# Patient Record
Sex: Female | Born: 1960
Health system: Southern US, Community
[De-identification: ages and names within clinical notes are randomized; demographics above are authoritative.]

## PROBLEM LIST (undated history)

## (undated) DIAGNOSIS — E669 Obesity, unspecified: Secondary | ICD-10-CM

## (undated) DIAGNOSIS — M1990 Unspecified osteoarthritis, unspecified site: Secondary | ICD-10-CM

## (undated) DIAGNOSIS — E079 Disorder of thyroid, unspecified: Secondary | ICD-10-CM

## (undated) DIAGNOSIS — I480 Paroxysmal atrial fibrillation: Secondary | ICD-10-CM

## (undated) DIAGNOSIS — M549 Dorsalgia, unspecified: Secondary | ICD-10-CM

## (undated) DIAGNOSIS — G8929 Other chronic pain: Secondary | ICD-10-CM

## (undated) DIAGNOSIS — E785 Hyperlipidemia, unspecified: Secondary | ICD-10-CM

## (undated) DIAGNOSIS — G459 Transient cerebral ischemic attack, unspecified: Secondary | ICD-10-CM

## (undated) DIAGNOSIS — I4891 Unspecified atrial fibrillation: Secondary | ICD-10-CM

## (undated) DIAGNOSIS — E039 Hypothyroidism, unspecified: Secondary | ICD-10-CM

## (undated) DIAGNOSIS — I10 Essential (primary) hypertension: Secondary | ICD-10-CM

## (undated) HISTORY — DX: Essential (primary) hypertension: I10

## (undated) HISTORY — DX: Unspecified atrial fibrillation: I48.91

## (undated) HISTORY — DX: Disorder of thyroid, unspecified: E07.9

## (undated) HISTORY — DX: Hyperlipidemia, unspecified: E78.5

## (undated) HISTORY — DX: Other chronic pain: G89.29

## (undated) HISTORY — DX: Dorsalgia, unspecified: M54.9

---

## 1990-11-18 HISTORY — PX: CHOLECYSTECTOMY: SHX55

## 2006-11-18 HISTORY — PX: ABDOMINAL HYSTERECTOMY: SHX81

## 2014-08-08 ENCOUNTER — Ambulatory Visit: Payer: Self-pay

## 2016-02-05 ENCOUNTER — Ambulatory Visit (INDEPENDENT_AMBULATORY_CARE_PROVIDER_SITE_OTHER): Payer: Self-pay | Admitting: Internal Medicine

## 2016-02-05 ENCOUNTER — Encounter: Payer: Self-pay | Admitting: Internal Medicine

## 2016-02-05 VITALS — BP 130/68 | HR 54 | Temp 98.0°F | Wt 228.4 lb

## 2016-02-05 DIAGNOSIS — E038 Other specified hypothyroidism: Secondary | ICD-10-CM

## 2016-02-05 DIAGNOSIS — E039 Hypothyroidism, unspecified: Secondary | ICD-10-CM | POA: Insufficient documentation

## 2016-02-05 DIAGNOSIS — E559 Vitamin D deficiency, unspecified: Secondary | ICD-10-CM | POA: Insufficient documentation

## 2016-02-05 DIAGNOSIS — Z0000 Encounter for general adult medical examination without abnormal findings: Secondary | ICD-10-CM | POA: Insufficient documentation

## 2016-02-05 DIAGNOSIS — I480 Paroxysmal atrial fibrillation: Secondary | ICD-10-CM

## 2016-02-05 DIAGNOSIS — D573 Sickle-cell trait: Secondary | ICD-10-CM

## 2016-02-05 DIAGNOSIS — I4891 Unspecified atrial fibrillation: Secondary | ICD-10-CM | POA: Insufficient documentation

## 2016-02-05 DIAGNOSIS — I10 Essential (primary) hypertension: Secondary | ICD-10-CM

## 2016-02-05 DIAGNOSIS — E785 Hyperlipidemia, unspecified: Secondary | ICD-10-CM

## 2016-02-05 LAB — BASIC METABOLIC PANEL WITH GFR
BUN: 14 mg/dL (ref 7–25)
CALCIUM: 9.1 mg/dL (ref 8.6–10.4)
CO2: 26 mmol/L (ref 20–31)
Chloride: 107 mmol/L (ref 98–110)
Creat: 1.08 mg/dL — ABNORMAL HIGH (ref 0.50–1.05)
GFR, EST AFRICAN AMERICAN: 66 mL/min (ref >=60)
GFR, EST NON AFRICAN AMERICAN: 58 mL/min — AB (ref >=60)
GLUCOSE: 87 mg/dL (ref 65–99)
Potassium: 4.2 mmol/L (ref 3.5–5.3)
SODIUM: 142 mmol/L (ref 135–146)

## 2016-02-05 LAB — TSH: TSH: 3.51 m[IU]/L

## 2016-02-05 NOTE — Assessment & Plan Note (Signed)
Has been taking Vitamin D 50,000 units every 2 weeks. States that the medication causes bone pain. She would like to stop it if she doesn't need it. - Will check a Vitamin D level today - May need to restart Vitamin D 50,000 units if Vitamin D level is low - Follow-up in 3 months

## 2016-02-05 NOTE — Progress Notes (Signed)
[name] Phone: [phone]  Subjective:  Kristi Duncan is here to establish care and meet her new physician. She recently moved from [place] to [place] to live with her daughter.   -Hypertension- Hasn't taken Lisinopril since December. Takes Norvasc  daily and Atenolol 57  daily. Endorses occasional chest pain, not always related to exertion. No lower extremity edema. Does not check her BPs at home. No medication side effects.    -Hypothyroidism- Hasn't taken the Levothyroxine since the end of February. She has cold intolerance, dry skin, occasional constipation. She has had some intentional weight loss. No fatigue.   -Atrial Fibrillation- Notices an irregular heartbeat every 2-3 weeks. Lasts a few minutes to 45 minutes. She monitors her caffeine intake. She takes Atenolol daily. No medication side effects. No fainting. She has taken Xarelto in the past, but had a lot of problems with bleeding from her gums. She followed with a cardiologist in [place], but has not found a cardiologist in [place] yet.   -Vitamin D deficiency- Has been told her Vitamin D levels are low. Was taking Vitamin D 50,000 units every 2 weeks. She feels like she has bone pain every time she takes the Vitamin D. Sometimes the bone pain lasts a couple of days and sometimes it can last up to 3 weeks. She would like to stop taking the Vitamin D if she doesn't need it.   -Health care maintenance- Last Pap in 2008, had a hysterectomy in 2008 for fibroids. Last mammogram in 2006. Has never had an abnormal mammogram. She has never had a colonoscopy. She would also like a referral to a dentist in the area for routine dental care.  ROS: See HPI for pertinent positives and negatives  Past Medical History- hypertension, hypothyroidism, hyperlipidemia, Vitamin D deficiency, sickle cell trait, paroxysmal A-fib  Past Surgical History- Cholecystectomy in 1992, Hysterectomy in 2008   Allergies- Codeine, Stadol,  Prednisone, Hydrocodone, Xarelto, HCTZ, Simvastatin   Medications- updated  Current Outpatient Prescriptions  Medication Sig Dispense Refill  . amLODipine (NORVASC) 5 MG tablet Take 5 mg by mouth daily.    Marland Kitchen atenolol (TENORMIN) 25 MG tablet Take 25 mg by mouth daily.    Marland Kitchen levothyroxine (SYNTHROID, LEVOTHROID) 75 MCG tablet Take 75 mcg by mouth daily before breakfast.    . lisinopril (PRINIVIL,ZESTRIL) 20 MG tablet Take 40 mg by mouth daily.    . Vitamin D, Ergocalciferol, (DRISDOL) 50000 units CAPS capsule Take 50,000 Units by mouth every 14 (fourteen) days.     No current facility-administered medications for this visit.   Chief complaint-noted Family history reviewed for today's visit. No changes. Social history- patient is a former smoker (quit 25 years ago). Occasional alcohol use (1 glass of wine). No drug use. Lives at home with 57 year old daughter.  Objective: There were no vitals taken for this visit. Gen: NAD, alert, cooperative with exam HEENT: NCAT, EOMI, MMM Neck: FROM, supple, no cervical lymphadenopathy, thyroid normal without nodules CV: RRR, no murmur Resp: CTABL, no wheezes, normal work of breathing GI: SNTND, BS present, no guarding or organomegaly Msk: No edema, warm, normal tone, moves UE/LE spontaneously Neuro: Alert and oriented, no gross deficits Skin: No rashes, no lesions Psych: Appropriate behavior  Assessment/Plan: Hypertension: Taking Norvasc  daily and Atenolol 57  daily. She is supposed to be taking Lisinopril 57 , but has not taken any since she ran out of refills in December. BP 148/70 in clinic, 130/68 on re-check. Pt states that she does not  want to take the Lisinopril if she doesn't have to. BP goal <140/<90. - Given that she is currently under her BP goal, will hold off on re-starting Lisinopril for now - Continue Norvasc 5mg  daily and Atenolol 25 mg daily - Advised Pt to check her blood pressures at home - Follow-up in 3  months  Hypothyroidism: Hasn't taken Levothyroxine 75mcg daily since running out of prescription at the end of February.  - Will check TSH today - If TSH level is high, will restart Levothyroxine 75mcg daily - Follow-up in 3 months  Atrial Fibrillation: Pt has episodes of palpitations every 2-3 weeks. They last a few seconds to 45 minutes. She was seen by a cardiologist in [place], but has not found a cardiologist in [place] since moving. Takes Atenolol 25mg  daily. Cardiac exam is normal today, not currently in A-fib. - Continue Atenolol 25mg  daily. - Pt referred to Cardiology to establish care. May need long term anticoagulation for paroxysmal A-fib.  - Follow-up in 3 months.  Vitamin D deficiency: Has been taking Vitamin D 50,000 units every 2 weeks. States that the medication causes bone pain. She would like to stop it if she doesn't need it. - Will check a Vitamin D level today - May need to restart Vitamin D 50,000 units if Vitamin D level is low - Follow-up in 3 months  Health Care Maintenance: Last pap in 2008 (had complete hysterectomy in 2008). Last mammogram in 2006. Has never had an abnormal pap or mammogram. Has never had a colonoscopy. Would also like referral to dentistry. She does not currently have insurance, but is in the process of getting the orange card. - Once she has the orange card, will refer Pt for mammogram, colonoscopy, and routine dental care. Will also do Hep C and HIV screening. - Follow-up in 3 months  Willadean CarolKaty Mayo, MD PGY-1

## 2016-02-05 NOTE — Assessment & Plan Note (Addendum)
Last pap in 2008 (had complete hysterectomy in 2008). Last mammogram in 2006. Has never had an abnormal pap or mammogram. Has never had a colonoscopy. Would also like referral to dentistry. She does not currently have insurance, but is in the process of getting the orange card. - Once she has the orange card, will refer Pt for mammogram, colonoscopy, and routine dental care. Will also do Hep C and HIV screening. - Follow-up in 3 months.

## 2016-02-05 NOTE — Assessment & Plan Note (Signed)
Hasn't taken Levothyroxine 75mcg daily since running out of prescription at the end of February.  - Will check TSH today - If TSH level is high, will restart Levothyroxine 75mcg daily - Follow-up in 3 months

## 2016-02-05 NOTE — Assessment & Plan Note (Signed)
Pt has episodes of palpitations every 2-3 weeks. They last a few seconds to 45 minutes. She was seen by a cardiologist in [place], but has not found a cardiologist in [place] since moving. Takes Atenolol 25mg  daily. Cardiac exam is normal today, not currently in A-fib. - Continue Atenolol 25mg  daily. - Pt referred to Cardiology to establish care. May need long term anticoagulation for paroxysmal A-fib.  - Follow-up in 3 months.

## 2016-02-05 NOTE — Patient Instructions (Signed)
It was so nice to meet you!  I have ordered some labs today- thyroid level, electrolyte panel, and Vitamin D level. I will call you with these results to discuss if we need to restart any of your medications. I will also send you a copy of the results in the mail for your records.  I have sent in a referral to Cardiology for you to establish care. You should hear from our office in the next 2 weeks to schedule an appointment.   [facility], I will send in referrals for you to get a mammogram and colonoscopy and for you to see the dentist.   We will see you back in 3 months!  Take care, Dr. Nancy MarusMayo

## 2016-02-05 NOTE — Assessment & Plan Note (Signed)
Taking Norvasc 5mg  daily and Atenolol 25mg  daily. She is supposed to be taking Lisinopril 40mg , but has not taken any since she ran out of refills in December. BP 148/70 in clinic, 130/68 on re-check. Pt states that she does not want to take the Lisinopril if she doesn't have to. BP goal <140/<90. - Given that she is currently under her BP goal, will hold off on re-starting Lisinopril for now - Continue Norvasc 5mg  daily and Atenolol 25 mg daily - Advised Pt to check her blood pressures at home - Follow-up in 3 months

## 2016-02-06 ENCOUNTER — Encounter: Payer: Self-pay | Admitting: Internal Medicine

## 2016-02-06 LAB — VITAMIN D 25 HYDROXY (VIT D DEFICIENCY, FRACTURES): VIT D 25 HYDROXY: 21 ng/mL — AB (ref 30–100)

## 2016-02-20 ENCOUNTER — Emergency Department (HOSPITAL_COMMUNITY): Payer: Self-pay

## 2016-02-20 ENCOUNTER — Encounter (HOSPITAL_COMMUNITY): Payer: Self-pay | Admitting: Emergency Medicine

## 2016-02-20 ENCOUNTER — Emergency Department (HOSPITAL_COMMUNITY)
Admission: EM | Admit: 2016-02-20 | Discharge: 2016-02-20 | Disposition: A | Discharge status: Home / Self Care | Payer: Self-pay | Attending: Emergency Medicine | Admitting: Emergency Medicine

## 2016-02-20 DIAGNOSIS — R001 Bradycardia, unspecified: Secondary | ICD-10-CM | POA: Insufficient documentation

## 2016-02-20 DIAGNOSIS — M5440 Lumbago with sciatica, unspecified side: Secondary | ICD-10-CM | POA: Insufficient documentation

## 2016-02-20 DIAGNOSIS — I10 Essential (primary) hypertension: Secondary | ICD-10-CM | POA: Insufficient documentation

## 2016-02-20 DIAGNOSIS — M5441 Lumbago with sciatica, right side: Secondary | ICD-10-CM

## 2016-02-20 DIAGNOSIS — R079 Chest pain, unspecified: Secondary | ICD-10-CM | POA: Insufficient documentation

## 2016-02-20 DIAGNOSIS — Z79899 Other long term (current) drug therapy: Secondary | ICD-10-CM | POA: Insufficient documentation

## 2016-02-20 DIAGNOSIS — N390 Urinary tract infection, site not specified: Secondary | ICD-10-CM | POA: Insufficient documentation

## 2016-02-20 DIAGNOSIS — G8929 Other chronic pain: Secondary | ICD-10-CM | POA: Insufficient documentation

## 2016-02-20 DIAGNOSIS — M5442 Lumbago with sciatica, left side: Secondary | ICD-10-CM

## 2016-02-20 DIAGNOSIS — Z87891 Personal history of nicotine dependence: Secondary | ICD-10-CM | POA: Insufficient documentation

## 2016-02-20 DIAGNOSIS — Z8639 Personal history of other endocrine, nutritional and metabolic disease: Secondary | ICD-10-CM | POA: Insufficient documentation

## 2016-02-20 LAB — BASIC METABOLIC PANEL
Anion gap: 7 (ref 5–15)
BUN: 17 mg/dL (ref 6–20)
CALCIUM: 9.3 mg/dL (ref 8.9–10.3)
CO2: 27 mmol/L (ref 22–32)
CREATININE: 0.98 mg/dL (ref 0.44–1.00)
Chloride: 108 mmol/L (ref 101–111)
GFR calc non Af Amer: >60 mL/min (ref >60)
GLUCOSE: 96 mg/dL (ref 65–99)
Potassium: 3.9 mmol/L (ref 3.5–5.1)
Sodium: 142 mmol/L (ref 135–145)

## 2016-02-20 LAB — URINALYSIS, ROUTINE W REFLEX MICROSCOPIC
BILIRUBIN URINE: NEGATIVE
GLUCOSE, UA: NEGATIVE mg/dL
HGB URINE DIPSTICK: NEGATIVE
KETONES UR: NEGATIVE mg/dL
Nitrite: NEGATIVE
Protein, ur: NEGATIVE mg/dL
Specific Gravity, Urine: 1.012 (ref 1.005–1.030)
pH: 6 (ref 5.0–8.0)

## 2016-02-20 LAB — TROPONIN I

## 2016-02-20 LAB — CBC WITH DIFFERENTIAL/PLATELET
BASOS ABS: 0 10*3/uL (ref 0.0–0.1)
Basophils Relative: 1 %
EOS ABS: 0.1 10*3/uL (ref 0.0–0.7)
Eosinophils Relative: 2 %
HCT: 37.7 % (ref 36.0–46.0)
HEMOGLOBIN: 12.8 g/dL (ref 12.0–15.0)
LYMPHS ABS: 1.2 10*3/uL (ref 0.7–4.0)
LYMPHS PCT: 23 %
MCH: 28.1 pg (ref 26.0–34.0)
MCHC: 34 g/dL (ref 30.0–36.0)
MCV: 82.7 fL (ref 78.0–100.0)
Monocytes Absolute: 0.5 10*3/uL (ref 0.1–1.0)
Monocytes Relative: 9 %
NEUTROS PCT: 65 %
Neutro Abs: 3.6 10*3/uL (ref 1.7–7.7)
PLATELETS: 242 10*3/uL (ref 150–400)
RBC: 4.56 MIL/uL (ref 3.87–5.11)
RDW: 13.5 % (ref 11.5–15.5)
WBC: 5.5 10*3/uL (ref 4.0–10.5)

## 2016-02-20 LAB — URINE MICROSCOPIC-ADD ON

## 2016-02-20 MED ORDER — HYDROMORPHONE HCL 2 MG PO TABS
2.0000 mg | ORAL_TABLET | Freq: Once | ORAL | Status: AC
  Administered 2016-02-20: 2 mg via ORAL
  Filled 2016-02-20: qty 1

## 2016-02-20 MED ORDER — SULFAMETHOXAZOLE-TRIMETHOPRIM 800-160 MG PO TABS: 1.0000 | ORAL_TABLET | Freq: Two times a day (BID) | ORAL | Status: DC

## 2016-02-20 MED ORDER — HYDROMORPHONE HCL 2 MG PO TABS: 2.0000 mg | ORAL_TABLET | Freq: Four times a day (QID) | ORAL | Status: DC | PRN

## 2016-02-20 NOTE — ED Notes (Signed)
Bed: WA13 Expected date:  Expected time:  Means of arrival:  Comments: EMS/back pain 

## 2016-02-20 NOTE — ED Provider Notes (Signed)
CSN: [id number]     Arrival date & time 02/20/16  [id number] History   First MD Initiated Contact with Patient 02/20/16 [phone]     Chief Complaint  Patient presents with  . Back Pain     (Consider location/radiation/quality/duration/timing/severity/associated sxs/prior Treatment) Patient is a 55 y.o. female presenting with back pain. The history is provided by the patient.  Back Pain Associated symptoms: chest pain   Associated symptoms: no abdominal pain, no headaches, no numbness and no weakness   Patient presents acute on chronic low back pain. Has been bad for a while but worse the last few days. It is dull and does good on both legs. States she gets spasms greatly. States she was sitting on the toilet today and she had a spasm and almost passed out. States she got up and was able to wash her face with some cold water began to feel better. No chest pain. No trouble breathing. Does have a history of atrial fibrillation and states she has been feeling some palpitations more frequently. Did not feel her heart going slow trend event. No fevers or chills. No loss of bladder bowel control. No headache. She's been doing well otherwise. She states she would occasionally get chest pain but has not had it recently. Patient states she had been on vitamin D for osteoporosis and then had been off because she felt bad. States that she started up recently and was feeling bad also.  Past Medical History  Diagnosis Date  . Hypertension   . Hyperlipidemia   . Thyroid disease    Past Surgical History  Procedure Laterality Date  . Cholecystectomy  1992  . Abdominal hysterectomy  2008   Family History  Problem Relation Age of Onset  . Stroke Maternal Grandmother   . Heart disease Maternal Grandmother   . Heart disease Maternal Grandfather   . Stroke Maternal Grandfather   . Stroke Paternal Grandmother   . Heart disease Paternal Grandmother   . Heart disease Paternal Grandfather   . Stroke Paternal  Grandfather    Social History  Substance Use Topics  . Smoking status: Former Smoker    Types: Cigarettes    Quit date: 11/19/1991  . Smokeless tobacco: None  . Alcohol Use: Yes     Comment: occasional   OB History    No data available     Review of Systems  Constitutional: Negative for activity change and appetite change.  Eyes: Negative for pain.  Respiratory: Negative for chest tightness and shortness of breath.   Cardiovascular: Positive for chest pain. Negative for leg swelling.  Gastrointestinal: Negative for nausea, vomiting, abdominal pain and diarrhea.  Genitourinary: Negative for flank pain.  Musculoskeletal: Positive for back pain. Negative for neck stiffness.  Skin: Negative for rash.  Neurological: Positive for light-headedness. Negative for weakness, numbness and headaches.  Psychiatric/Behavioral: Negative for behavioral problems.      Allergies  Iodine; Codeine; Hctz; Hydrocodone; Ivp dye; Prednisone; Simvastatin; Stadol; Vitamin d analogs; Xarelto; and Nsaids  Home Medications   Prior to Admission medications   Medication Sig Start Date End Date Taking? Authorizing Provider  acetaminophen (TYLENOL) 500 MG tablet Take 500 mg by mouth every 6 (six) hours as needed for mild pain, moderate pain, fever or headache.   Yes Historical Provider, MD  amLODipine (NORVASC) 5 MG tablet Take 5 mg by mouth daily.   Yes Historical Provider, MD  atenolol (TENORMIN) 25 MG tablet Take 25 mg by mouth daily.   Yes Historical  Provider, MD  diltiazem (CARDIZEM LA) 180 MG 24 hr tablet Take 180 mg by mouth daily as needed (for heart issues).   Yes Historical Provider, MD  Vitamin D, Ergocalciferol, (DRISDOL) 50000 units CAPS capsule Take 50,000 Units by mouth every 14 (fourteen) days. Reported on 02/20/2016   Yes Historical Provider, MD  HYDROmorphone (DILAUDID) 2 MG tablet Take 1 tablet (2 mg total) by mouth every 6 (six) hours as needed for severe pain. 02/20/16   Benjiman Core, MD   sulfamethoxazole-trimethoprim (BACTRIM DS,SEPTRA DS) 800-160 MG tablet Take 1 tablet by mouth 2 (two) times daily. 02/20/16   Benjiman Core, MD   BP 136/60 mmHg  Pulse 46  Temp(Src) 98.4 F (36.9 C) (Oral)  Resp 16  SpO2 99% Physical Exam  Constitutional: She appears well-developed.  HENT:  Head: Normocephalic.  Neck: Neck supple.  Cardiovascular: Normal rate.   Pulmonary/Chest: Effort normal.  Abdominal: There is no tenderness.  Musculoskeletal: She exhibits tenderness.  Lumbar paraspinal tenderness. Pain with straight leg raise bilaterally. Peroneal sensation intact.  Neurological: She is alert.  Skin: Skin is warm.    ED Course  Procedures (including critical care time) Labs Review Labs Reviewed  URINALYSIS, ROUTINE W REFLEX MICROSCOPIC (NOT AT [facility]) - Abnormal; Notable for the following:    Leukocytes, UA MODERATE (*)    All other components within normal limits  URINE MICROSCOPIC-ADD ON - Abnormal; Notable for the following:    Squamous Epithelial / LPF 0-5 (*)    Bacteria, UA FEW (*)    All other components within normal limits  I-STAT CHEM 8, ED - Abnormal; Notable for the following:    Potassium 6.8 (*)    BUN 24 (*)    Calcium, Ion 1.06 (*)    All other components within normal limits  TROPONIN I  CBC WITH DIFFERENTIAL/PLATELET  BASIC METABOLIC PANEL    Imaging Review Dg Lumbar Spine Complete  02/20/2016  CLINICAL DATA:  Chronic back pain.  Vagal response. EXAM: LUMBAR SPINE - COMPLETE 4+ VIEW COMPARISON:  None. FINDINGS: There are 5 nonrib bearing lumbar-type vertebral bodies. The vertebral body heights are maintained. The alignment is anatomic. There is no spondylolysis. There is no static listhesis. There is no acute fracture. Mild degenerative disc disease at L4-5. Bilateral facet arthropathy at L3-4 and L4-5. The SI joints are unremarkable. IMPRESSION: No acute osseous injury of the lumbar spine. Mild lumbar spine spondylosis as described above.  Electronically Signed   By: Elige Ko   On: 02/20/2016 10:14   I have personally reviewed and evaluated these images and lab results as part of my medical decision-making.   EKG Interpretation   Date/Time:  Tuesday February 20 2016 09:32:24 EDT Ventricular Rate:  51 PR Interval:  131 QRS Duration: 92 QT Interval:  453 QTC Calculation: 417 R Axis:   2 Text Interpretation:  Sinus rhythm Right atrial enlargement Low voltage,  extremity and precordial leads Confirmed by Sergi Gellner  MD, Harrold Donath [phone])  on 02/20/2016 10:16:00 AM      MDM   Final diagnoses:  Bilateral low back pain with sciatica, sciatica laterality unspecified  Acute lower UTI (urinary tract infection)    Patient with back pain. Acute on chronic. X-ray reassuring. Patient has mild bradycardia. Had a near syncopal episode was likely vagal. Lab work reassuring but does have a possible urinary tract infection. Will treat with antibodies. Will discharge home follow-up as needed. She R he has cardiology follow-up. Given neurosurgery information for her back pain  Benjiman Core, MD 02/20/16 [phone]

## 2016-02-20 NOTE — ED Notes (Signed)
Per EMS, from home, c/o chronic back pain that has worsened after having a vagal response and felt faint. Did not lose consciousness. After episode had significantly worsened back pain that ran down both legs. Left leg worse than right leg, pulse movement sensation all intact. Hx of sciatica   20 LAC, no meds given in route.

## 2016-02-20 NOTE — Discharge Instructions (Signed)
Urinary Tract Infection Urinary tract infections (UTIs) can develop anywhere along your urinary tract. Your urinary tract is your body's drainage system for removing wastes and extra water. Your urinary tract includes two kidneys, two ureters, a bladder, and a urethra. Your kidneys are a pair of bean-shaped organs. Each kidney is about the size of your fist. They are located below your ribs, one on each side of your spine. CAUSES Infections are caused by microbes, which are microscopic organisms, including fungi, viruses, and bacteria. These organisms are so small that they can only be seen through a microscope. Bacteria are the microbes that most commonly cause UTIs. SYMPTOMS  Symptoms of UTIs may vary by age and gender of the patient and by the location of the infection. Symptoms in young women typically include a frequent and intense urge to urinate and a painful, burning feeling in the bladder or urethra during urination. Older women and men are more likely to be tired, shaky, and weak and have muscle aches and abdominal pain. A fever may mean the infection is in your kidneys. Other symptoms of a kidney infection include pain in your back or sides below the ribs, nausea, and vomiting. DIAGNOSIS To diagnose a UTI, your caregiver will ask you about your symptoms. Your caregiver will also ask you to provide a urine sample. The urine sample will be tested for bacteria and white blood cells. White blood cells are made by your body to help fight infection. TREATMENT  Typically, UTIs can be treated with medication. Because most UTIs are caused by a bacterial infection, they usually can be treated with the use of antibiotics. The choice of antibiotic and length of treatment depend on your symptoms and the type of bacteria causing your infection. HOME CARE INSTRUCTIONS  If you were prescribed antibiotics, take them exactly as your caregiver instructs you. Finish the medication even if you feel better after  you have only taken some of the medication.  Drink enough water and fluids to keep your urine clear or pale yellow.  Avoid caffeine, tea, and carbonated beverages. They tend to irritate your bladder.  Empty your bladder often. Avoid holding urine for long periods of time.  Empty your bladder before and after sexual intercourse.  After a bowel movement, women should cleanse from front to back. Use each tissue only once. SEEK MEDICAL CARE IF:   You have back pain.  You develop a fever.  Your symptoms do not begin to resolve within 3 days. SEEK IMMEDIATE MEDICAL CARE IF:   You have severe back pain or lower abdominal pain.  You develop chills.  You have nausea or vomiting.  You have continued burning or discomfort with urination. MAKE SURE YOU:   Understand these instructions.  Will watch your condition.  Will get help right away if you are not doing well or get worse.   This information is not intended to replace advice given to you by your health care provider. Make sure you discuss any questions you have with your health care provider.   Document Released: 08/14/2005 Document Revised: 07/26/2015 Document Reviewed: 12/13/2011 Elsevier Interactive Patient Education 2016 [employer] Inc[employer] Sciatica Sciatica is pain, weakness, numbness, or tingling along the path of the sciatic nerve. The nerve starts in the lower back and runs down the back of each leg. The nerve controls the muscles in the lower leg and in the back of the knee, while also providing sensation to the back of the thigh, lower leg, and the sole   of your foot. Sciatica is a symptom of another medical condition. For instance, nerve damage or certain conditions, such as a herniated disk or bone spur on the spine, pinch or put pressure on the sciatic nerve. This causes the pain, weakness, or other sensations normally associated with sciatica. Generally, sciatica only affects one side of the body. CAUSES   Herniated or  slipped disc.  Degenerative disk disease.  A pain disorder involving the narrow muscle in the buttocks (piriformis syndrome).  Pelvic injury or fracture.  Pregnancy.  Tumor (rare). SYMPTOMS  Symptoms can vary from mild to very severe. The symptoms usually travel from the low back to the buttocks and down the back of the leg. Symptoms can include:  Mild tingling or dull aches in the lower back, leg, or hip.  Numbness in the back of the calf or sole of the foot.  Burning sensations in the lower back, leg, or hip.  Sharp pains in the lower back, leg, or hip.  Leg weakness.  Severe back pain inhibiting movement. These symptoms may get worse with coughing, sneezing, laughing, or prolonged sitting or standing. Also, being overweight may worsen symptoms. DIAGNOSIS  Your caregiver will perform a physical exam to look for common symptoms of sciatica. He or she may ask you to do certain movements or activities that would trigger sciatic nerve pain. Other tests may be performed to find the cause of the sciatica. These may include:  Blood tests.  X-rays.  Imaging tests, such as an MRI or CT scan. TREATMENT  Treatment is directed at the cause of the sciatic pain. Sometimes, treatment is not necessary and the pain and discomfort goes away on its own. If treatment is needed, your caregiver may suggest:  Over-the-counter medicines to relieve pain.  Prescription medicines, such as anti-inflammatory medicine, muscle relaxants, or narcotics.  Applying heat or ice to the painful area.  Steroid injections to lessen pain, irritation, and inflammation around the nerve.  Reducing activity during periods of pain.  Exercising and stretching to strengthen your abdomen and improve flexibility of your spine. Your caregiver may suggest losing weight if the extra weight makes the back pain worse.  Physical therapy.  Surgery to eliminate what is pressing or pinching the nerve, such as a bone spur  or part of a herniated disk. HOME CARE INSTRUCTIONS   Only take over-the-counter or prescription medicines for pain or discomfort as directed by your caregiver.  Apply ice to the affected area for 20 minutes, 3-4 times a day for the first 48-72 hours. Then try heat in the same way.  Exercise, stretch, or perform your usual activities if these do not aggravate your pain.  Attend physical therapy sessions as directed by your caregiver.  Keep all follow-up appointments as directed by your caregiver.  Do not wear high heels or shoes that do not provide proper support.  Check your mattress to see if it is too soft. A firm mattress may lessen your pain and discomfort. SEEK IMMEDIATE MEDICAL CARE IF:   You lose control of your bowel or bladder (incontinence).  You have increasing weakness in the lower back, pelvis, buttocks, or legs.  You have redness or swelling of your back.  You have a burning sensation when you urinate.  You have pain that gets worse when you lie down or awakens you at night.  Your pain is worse than you have experienced in the past.  Your pain is lasting longer than 4 weeks.  You are   suddenly losing weight without reason. MAKE SURE YOU:  Understand these instructions.  Will watch your condition.  Will get help right away if you are not doing well or get worse.   This information is not intended to replace advice given to you by your health care provider. Make sure you discuss any questions you have with your health care provider.   Document Released: 10/29/2001 Document Revised: 07/26/2015 Document Reviewed: 03/15/2012 Elsevier Interactive Patient Education 2016 Elsevier Inc[employer]  

## 2016-02-21 LAB — I-STAT CHEM 8, ED
BUN: 24 mg/dL — AB (ref 6–20)
CALCIUM ION: 1.06 mmol/L — AB (ref 1.12–1.23)
CHLORIDE: 108 mmol/L (ref 101–111)
CREATININE: 1 mg/dL (ref 0.44–1.00)
Glucose, Bld: 96 mg/dL (ref 65–99)
HCT: 40 % (ref 36.0–46.0)
Hemoglobin: 13.6 g/dL (ref 12.0–15.0)
POTASSIUM: 6.8 mmol/L — AB (ref 3.5–5.1)
SODIUM: 139 mmol/L (ref 135–145)
TCO2: 24 mmol/L (ref 0–100)

## 2016-02-21 NOTE — Progress Notes (Signed)
Electrophysiology Office Note   Date:  02/22/2016   ID:  Kristi Duncan, DOB 08/08/1961, MRN [id number]  PCP:  Hilton Sinclair, MD  Primary Electrophysiologist:  Regan Lemming, MD    Chief Complaint  Patient presents with  . New Patient (Initial Visit)  . Atrial Fibrillation     History of Present Illness: Kristi Duncan is a 55 y.o. female who presents today for electrophysiology evaluation.   She has a history of hypertension, hyperlipidemia, hyperthyroidism, and atrial fibrillation.  She notices palpitations every 2-3 weeks, last minutes to 45 seconds.  She takes daily atenolol.  She has taken Xarelto in the past but did not tolerate it due to gum bleeding.  She says that she gets brief episodes of atrial fibrillation. She takes diltiazem as needed to help slow her rate down. She also takes atenolol which keeps her heart rate slow at baseline.she says that she feels well otherwise, and her most recent episode of atrial fibrillation was months ago. She has been followed in [place] in the past and moved here approximately one year ago and has yet to establish care.  Today, she denies symptoms of palpitations, chest pain, shortness of breath, orthopnea, PND, lower extremity edema, claudication, dizziness, presyncope, syncope, bleeding, or neurologic sequela. The patient is tolerating medications without difficulties and is otherwise without complaint today.    Past Medical History  Diagnosis Date  . Hypertension   . Hyperlipidemia   . Thyroid disease   . A-fib (HCC)   . Thyroid disease   . Chronic back pain    Past Surgical History  Procedure Laterality Date  . Cholecystectomy  1992  . Abdominal hysterectomy  2008     Current Outpatient Prescriptions  Medication Sig Dispense Refill  . acetaminophen (TYLENOL) 500 MG tablet Take 500 mg by mouth every 6 (six) hours as needed for mild pain, moderate pain, fever or headache.    Marland Kitchen amLODipine (NORVASC) 5 MG tablet Take 5 mg  by mouth daily.    Marland Kitchen atenolol (TENORMIN) 25 MG tablet Take 25 mg by mouth daily.    Marland Kitchen diltiazem (CARDIZEM) 120 MG tablet Take 120 mg by mouth 2 (two) times daily as needed (for fast heart rate).    Marland Kitchen HYDROmorphone (DILAUDID) 2 MG tablet Take 1 tablet (2 mg total) by mouth every 6 (six) hours as needed for severe pain. 10 tablet 0  . sulfamethoxazole-trimethoprim (BACTRIM DS,SEPTRA DS) 800-160 MG tablet Take 1 tablet by mouth 2 (two) times daily. 6 tablet 0  . Vitamin D, Ergocalciferol, (DRISDOL) 50000 units CAPS capsule Take 50,000 Units by mouth every 14 (fourteen) days. Reported on 02/20/2016     No current facility-administered medications for this visit.    Allergies:   Iodine; Codeine; Hctz; Hydrocodone; Ivp dye; Prednisone; Simvastatin; Stadol; Vitamin d analogs; Xarelto; and Nsaids   Social History:  The patient  reports that she quit smoking about 24 years ago. Her smoking use included Cigarettes. She does not have any smokeless tobacco history on file. She reports that she drinks alcohol. She reports that she does not use illicit drugs.   Family History:  The patient's family history includes Heart disease in her maternal grandmother; Stroke in her maternal grandfather.    ROS:  Please see the history of present illness.   Otherwise, review of systems is positive for palpitations, snoring, back pain, dizziness, headaches.   All other systems are reviewed and negative.    PHYSICAL EXAM: VS:  BP 130/78 mmHg  Pulse 62  Ht 5' 7.5" (1.715 m)  Wt 222 lb 9.6 oz (100.971 kg)  BMI 34.33 kg/m2 , BMI Body mass index is 34.33 kg/(m^2). GEN: Well nourished, well developed, in no acute distress HEENT: normal Neck: no JVD, carotid bruits, or masses Cardiac: RRR; no murmurs, rubs, or gallops,no edema  Respiratory:  clear to auscultation bilaterally, normal work of breathing GI: soft, nontender, nondistended, + BS MS: no deformity or atrophy Skin: warm and dry Neuro:  Strength and sensation  are intact Psych: euthymic mood, full affect  EKG:  EKG is not ordered today. The ekg ordered 4/4/17shows sinus rhythm, RAA  Recent Labs: 02/05/2016: TSH 3.51 02/20/2016: BUN 17; Creatinine, Ser 0.98; Hemoglobin 13.6; Platelets 242; Potassium 3.9; Sodium 142    Lipid Panel  No results found for: CHOL, TRIG, HDL, CHOLHDL, VLDL, LDLCALC, LDLDIRECT   Wt Readings from Last 3 Encounters:  02/22/16 222 lb 9.6 oz (100.971 kg)  02/05/16 228 lb 6.4 oz (103.602 kg)       ASSESSMENT AND PLAN:  1.  Paroxysmal atrial fibrillation: seems fairly well controlled, with only a few episodes per year. She is not anticoagulated currently as she has had both bloody bowel movements and bleeding gums while on Xarelto.  At this time she does not wish to start Xarelto. She says that she is being set up with a dentist as well as for colonoscopy in the future and she may be more amenable to starting anticoagulation at that time. We unfortunately do not have records for her, and we Raghav Verrilli try and get them from [place].    Current medicines are reviewed at length with the patient today.   The patient does not have concerns regarding her medicines.  The following changes were made today:  none  Labs/ tests ordered today include:  No orders of the defined types were placed in this encounter.     Disposition:   FU with Evita Merida 3 months  Signed, Merrilyn Legler Jorja LoaMartin Neriah Brott, MD  02/22/2016 11:56 AM     [facility] [address] [place] [address] [phone] (office) [phone] (fax)

## 2016-02-22 ENCOUNTER — Ambulatory Visit (INDEPENDENT_AMBULATORY_CARE_PROVIDER_SITE_OTHER): Payer: Self-pay | Admitting: Cardiology

## 2016-02-22 ENCOUNTER — Encounter: Payer: Self-pay | Admitting: Cardiology

## 2016-02-22 VITALS — BP 130/78 | HR 62 | Ht 67.5 in | Wt 222.6 lb

## 2016-02-22 DIAGNOSIS — I480 Paroxysmal atrial fibrillation: Secondary | ICD-10-CM

## 2016-02-22 MED ORDER — DILTIAZEM HCL 120 MG PO TABS: 120.0000 mg | ORAL_TABLET | Freq: Two times a day (BID) | ORAL | Status: DC | PRN

## 2016-02-22 NOTE — Addendum Note (Signed)
Addended by: Baird LyonsPRICE, Maryna Yeagle L on: 02/22/2016 12:05 PM   Modules accepted: Orders, Medications

## 2016-02-22 NOTE — Patient Instructions (Signed)
Medication Instructions:  Your physician recommends that you continue on your current medications as directed. Please refer to the Current Medication list given to you today.  Labwork: None ordered  Testing/Procedures: None ordered  Follow-Up: Your physician recommends that you schedule a follow-up appointment in: 3 months with Dr. Elberta Fortisamnitz.  Any Other Special Instructions Will Be Listed Below (If Applicable). - [facility]  If you need a refill on your cardiac medications before your next appointment, please call your pharmacy.  Thank you for choosing [facility]!!   Dory HornSherri Litha Lamartina, RN [phone]

## 2016-02-29 ENCOUNTER — Telehealth: Payer: Self-pay | Admitting: Internal Medicine

## 2016-02-29 NOTE — Telephone Encounter (Signed)
Will forward to MD. Zyriah Mask,CMA  

## 2016-02-29 NOTE — Telephone Encounter (Signed)
I spoke with Kristi Duncan on the phone about a referral to Neurosurgery. She states she was seen in the ED, who recommended that she be evaluated by Neurosurgery. She states she would like to avoid surgery if possible. She has an appointment scheduled with Dr. Alvester MorinNewton on 4/17 to discuss her back pain. At this point, I think it would be best to allow Dr. Alvester MorinNewton and Ms. Hallinan to discuss possible referral at her next appointment. Pt voiced understanding and was in agreement with the plan.

## 2016-02-29 NOTE — Telephone Encounter (Signed)
Patient have the orange card.

## 2016-02-29 NOTE — Telephone Encounter (Signed)
Need referral to Dr. Wendall StadeNelesh Nundkumar at the Neuro office at [address]

## 2016-03-04 ENCOUNTER — Ambulatory Visit (INDEPENDENT_AMBULATORY_CARE_PROVIDER_SITE_OTHER): Payer: Self-pay | Admitting: Family Medicine

## 2016-03-04 ENCOUNTER — Encounter: Payer: Self-pay | Admitting: Family Medicine

## 2016-03-04 VITALS — BP 130/70 | HR 57 | Temp 98.3°F | Ht 67.5 in | Wt 227.0 lb

## 2016-03-04 DIAGNOSIS — I10 Essential (primary) hypertension: Secondary | ICD-10-CM

## 2016-03-04 DIAGNOSIS — M5442 Lumbago with sciatica, left side: Secondary | ICD-10-CM

## 2016-03-04 DIAGNOSIS — I480 Paroxysmal atrial fibrillation: Secondary | ICD-10-CM

## 2016-03-04 NOTE — Progress Notes (Signed)
   Subjective:   Kristi Duncan is a 55 y.o. female with a history of  trait, HTN, hypothyroid, Afib.   Here for  Chief Complaint  Patient presents with  . Back Pain   Has has this for "years" with worsening periodically. Most recently went to ED for severe left leg pain associated with numbness and tingling to left toes with shooting pain down the left leg. Denies loss of strength. Denies loss of bowel or bladder function. Reports mild pain her bilateral hips as well. She is taking dilaudid which was rx by ED. She has taken 3 tablets (only prescribed 10). She is also using extra strength tylenol and heating pads with mild relief of sx.  Pain is worse after imobility and with prolonged standing. Worse with twisting. She is not currently having the tingling and shooting pain in her leg.   Reviewed her multiple allergies today, especially given NSAID-- she does not remember if she had ibuprofen and if this caused pain.   Health Maintenance Due  Topic Date Due  . Hepatitis C Screening  04-17-1961  . HIV Screening  06/07/1974  . TETANUS/TDAP  06/07/1978  . MAMMOGRAM  06/07/2009  . COLONOSCOPY  06/07/2009    Review of Systems:   Per HPI. All other systems reviewed and are negative expect as in HPI   PMH, PSH, Medications, Allergies, SocialHx and FHx reviewed and updated in EMR- marked as reviewed 03/04/2016   Objective:  BP 130/70 mmHg  Pulse 57  Temp(Src) 98.3 F (36.8 C) (Oral)  Ht 5' 7.5" (1.715 m)  Wt 227 lb (102.967 kg)  BMI 35.01 kg/m2  SpO2 99%  Gen:  55 y.o. female in NAD. Speaking in full sentences. Good eye contact HEENT: NCAT, MMM, EOMI, PERRL, anicteric sclerae.  CV: RRR, no MRG, no JVD Resp: Normal WOB. Non-labored, CTAB, no wheezes noted GI: Soft, NTND, BS present, no guarding or organomegaly Ext: WWP, no edema MSK: Intact gait. Full ROM. Negative straight leg raise. + faber, negative fadir.  Neuro: Alert and oriented, speech normal Pysch: Normal mood and  affect.       Chemistry      Component Value Date/Time   NA 142 02/20/2016 1006   K 3.9 02/20/2016 1006   CL 108 02/20/2016 1006   CO2 27 02/20/2016 1006   BUN 17 02/20/2016 1006   CREATININE 0.98 02/20/2016 1006   CREATININE 1.08* 02/05/2016 1054      Component Value Date/Time   CALCIUM 9.3 02/20/2016 1006      No results found for: HGBA1C Assessment:     Kristi EasternMazie F Duncan is a 55 y.o. female here for back pain consistent with sciatica.     Plan:   #Back pain - Likely sciatica, chronic with intermittent worsening. + muscle tightness in hamstring.  - Encouraged patient to try Ibuprofen 200mg  PO, increase to 600mg  if she tolerates this. Patient has a vague allergy to NSAIDS. She was taking ASA at the time as well and is willing to try ibuprofen again - Referral to PT made today - Reviewed and demonstrated back stretches, given AVS print out - Do not think imaging warranted at this time.  - RTC in 4 weeks   Federico FlakeKimberly Niles Zaccheus Edmister, MD, [facility] OB Fellow, [facility] 03/04/2016  1:30 PM

## 2016-03-04 NOTE — Patient Instructions (Addendum)
Ibuprofen -- try 200mg  every 6 hours for back pain. You can increase to 600mg  (3 tablets) every 6 hours if you need to.    Back Exercises The following exercises strengthen the muscles that help to support the back. They also help to keep the lower back flexible. Doing these exercises can help to prevent back pain or lessen existing pain. If you have back pain or discomfort, try doing these exercises 2-3 times each day or as told by your health care provider. When the pain goes away, do them once each day, but increase the number of times that you repeat the steps for each exercise (do more repetitions). If you do not have back pain or discomfort, do these exercises once each day or as told by your health care provider. EXERCISES Single Knee to Chest Repeat these steps 3-5 times for each leg: 1. Lie on your back on a firm bed or the floor with your legs extended. 2. Bring one knee to your chest. Your other leg should stay extended and in contact with the floor. 3. Hold your knee in place by grabbing your knee or thigh. 4. Pull on your knee until you feel a gentle stretch in your lower back. 5. Hold the stretch for 10-30 seconds. 6. Slowly release and straighten your leg. Pelvic Tilt Repeat these steps 5-10 times: 1. Lie on your back on a firm bed or the floor with your legs extended. 2. Bend your knees so they are pointing toward the ceiling and your feet are flat on the floor. 3. Tighten your lower abdominal muscles to press your lower back against the floor. This motion will tilt your pelvis so your tailbone points up toward the ceiling instead of pointing to your feet or the floor. 4. With gentle tension and even breathing, hold this position for 5-10 seconds. Cat-Cow Repeat these steps until your lower back becomes more flexible: 1. Get into a hands-and-knees position on a firm surface. Keep your hands under your shoulders, and keep your knees under your hips. You may place padding under  your knees for comfort. 2. Let your head hang down, and point your tailbone toward the floor so your lower back becomes rounded like the back of a cat. 3. Hold this position for 5 seconds. 4. Slowly lift your head and point your tailbone up toward the ceiling so your back forms a sagging arch like the back of a cow. 5. Hold this position for 5 seconds. Press-Ups Repeat these steps 5-10 times: 1. Lie on your abdomen (face-down) on the floor. 2. Place your palms near your head, about shoulder-width apart. 3. While you keep your back as relaxed as possible and keep your hips on the floor, slowly straighten your arms to raise the top half of your body and lift your shoulders. Do not use your back muscles to raise your upper torso. You may adjust the placement of your hands to make yourself more comfortable. 4. Hold this position for 5 seconds while you keep your back relaxed. 5. Slowly return to lying flat on the floor. Bridges Repeat these steps 10 times: 1. Lie on your back on a firm surface. 2. Bend your knees so they are pointing toward the ceiling and your feet are flat on the floor. 3. Tighten your buttocks muscles and lift your buttocks off of the floor until your waist is at almost the same height as your knees. You should feel the muscles working in your buttocks and the  back of your thighs. If you do not feel these muscles, slide your feet 1-2 inches farther away from your buttocks. 4. Hold this position for 3-5 seconds. 5. Slowly lower your hips to the starting position, and allow your buttocks muscles to relax completely. If this exercise is too easy, try doing it with your arms crossed over your chest. Abdominal Crunches Repeat these steps 5-10 times: 1. Lie on your back on a firm bed or the floor with your legs extended. 2. Bend your knees so they are pointing toward the ceiling and your feet are flat on the floor. 3. Cross your arms over your chest. 4. Tip your chin slightly  toward your chest without bending your neck. 5. Tighten your abdominal muscles and slowly raise your trunk (torso) high enough to lift your shoulder blades a tiny bit off of the floor. Avoid raising your torso higher than that, because it can put too much stress on your low back and it does not help to strengthen your abdominal muscles. 6. Slowly return to your starting position. Back Lifts Repeat these steps 5-10 times: 1. Lie on your abdomen (face-down) with your arms at your sides, and rest your forehead on the floor. 2. Tighten the muscles in your legs and your buttocks. 3. Slowly lift your chest off of the floor while you keep your hips pressed to the floor. Keep the back of your head in line with the curve in your back. Your eyes should be looking at the floor. 4. Hold this position for 3-5 seconds. 5. Slowly return to your starting position. SEEK MEDICAL CARE IF:  Your back pain or discomfort gets much worse when you do an exercise.  Your back pain or discomfort does not lessen within 2 hours after you exercise. If you have any of these problems, stop doing these exercises right away. Do not do them again unless your health care provider says that you can. SEEK IMMEDIATE MEDICAL CARE IF:  You develop sudden, severe back pain. If this happens, stop doing the exercises right away. Do not do them again unless your health care provider says that you can.   This information is not intended to replace advice given to you by your health care provider. Make sure you discuss any questions you have with your health care provider.   Document Released: 12/12/2004 Document Revised: 07/26/2015 Document Reviewed: 12/29/2014 Elsevier Interactive Patient Education [employer]

## 2016-03-12 ENCOUNTER — Ambulatory Visit: Payer: Self-pay | Attending: Family Medicine | Admitting: Physical Therapy

## 2016-03-12 DIAGNOSIS — R2689 Other abnormalities of gait and mobility: Secondary | ICD-10-CM | POA: Insufficient documentation

## 2016-03-12 DIAGNOSIS — M5442 Lumbago with sciatica, left side: Secondary | ICD-10-CM | POA: Insufficient documentation

## 2016-03-12 DIAGNOSIS — R293 Abnormal posture: Secondary | ICD-10-CM | POA: Insufficient documentation

## 2016-03-12 DIAGNOSIS — R252 Cramp and spasm: Secondary | ICD-10-CM | POA: Insufficient documentation

## 2016-03-12 DIAGNOSIS — M6281 Muscle weakness (generalized): Secondary | ICD-10-CM | POA: Insufficient documentation

## 2016-03-12 NOTE — Therapy (Signed)
[place], [place], [address] Phone: [phone]   Fax:  [phone]  Physical Therapy Evaluation  Patient Details  Name: Kristi Duncan MRN: [id number] Date of Birth: 22-Sep-1961 Referring Provider: Caren Macadam, MD  Encounter Date: 03/12/2016      PT End of Session - 03/12/16 1747    Visit Number 1   Number of Visits 8   Date for PT Re-Evaluation 05/07/16   Authorization Type Self Pay: Reassess at 4th visit on progress and financial assist/ [facility] due to self pay   PT Start Time 1630   PT Stop Time 1719   PT Time Calculation (min) 49 min   Activity Tolerance Patient tolerated treatment well   Behavior During Therapy [facility] for tasks assessed/performed      Past Medical History  Diagnosis Date  . Hypertension   . Hyperlipidemia   . Thyroid disease   . A-fib ([place])   . Thyroid disease   . Chronic back pain     Past Surgical History  Procedure Laterality Date  . Cholecystectomy  1992  . Abdominal hysterectomy  2008    There were no vitals filed for this visit.       Subjective Assessment - 03/12/16 1646    Subjective pt is a 55 y.o F with CC or chronic  L LBP with referral down to toes of L foot with recent exacerbation on 02/20/2016 that started insidiously. She reported having to go to the ED on 02/20/2016 via ambulance. 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 Since onset she reports the pain has gotten better, but does have fluctuating days with it being worse in the mornings. she denies any bowel or bladder issues or saddle paresthesia.    Limitations Lifting;House hold activities;Standing   How long can you sit comfortably? depends on surface on average:15 min   How long can you stand comfortably? 15 min   How long can you walk comfortably? 15 min   Diagnostic tests 02/20/2016 x-ray   Patient Stated Goals to decrease pain, to be able to walk  better, improve bending, and improve endurance   Currently in Pain? Yes   Pain Score 2    Pain Location Back   Pain Orientation Left;Lower   Pain Descriptors / Indicators Aching;Dull;Burning  pinching,   Pain Type --  sub-acute on chronic   Pain Radiating Towards to the L toes   Pain Onset More than a month ago   Pain Frequency Intermittent   Aggravating Factors  walking/ standing, bending forward,    Pain Relieving Factors pain medication, laying down,    Effect of Pain on Daily Activities limited endurance with standing, lifting, moving around            [facility] PT Assessment - 03/12/16 1638    Assessment   Medical Diagnosis L sided low back pain with Sciatica   Referring Provider Caren Macadam, MD   Onset Date/Surgical Date 02/20/16  recent exacerbation but chronic hx or LBP   Hand Dominance Right   Next MD Visit 04/01/2016   Prior Therapy yes   Precautions   Precautions None   Precaution Comments no lifting over 20LBs. , use lumbar support brace, avoid over the head or pick items off floor, 10 min breaks eery 2 hours for stretching of back, avoid twisting.    Restrictions   Weight Bearing Restrictions No   Balance Screen  Has the patient fallen in the past 6 months No   Has the patient had a decrease in activity level because of a fear of falling?  No   Is the patient reluctant to leave their home because of a fear of falling?  No   Home Environment   Living Environment Private residence   Living Arrangements Children   Available Help at Discharge Available PRN/intermittently   Type of [place] Access Level entry   [place] Two level   Alternate Level Stairs-Number of Steps 13   Alternate [place] - single point  reacher   Prior Function   Level of [place] Independent;Independent with basic ADLs   Vocation Part time employment  day care   Vocation Requirements lifting, moving, twisting, carrying,  standing, walking,    Cognition   Overall Cognitive Status Within Functional Limits for tasks assessed   Observation/Other Assessments   Focus on Therapeutic Outcomes (FOTO)  44% limited  predicted 27% limited   Posture/Postural Control   Posture/Postural Control Postural limitations   Postural Limitations Rounded Shoulders;Forward head;Increased thoracic kyphosis;Increased lumbar lordosis   ROM / Strength   AROM / PROM / Strength PROM;AROM;Strength   AROM   AROM Assessment Site Lumbar   Lumbar Flexion 55   Lumbar Extension 10   Lumbar - Right Side Bend 25   Lumbar - Left Side Bend 15   Strength   Strength Assessment Site Hip;Knee   Right/Left Hip Left;Right   Right Hip Flexion 3+/5   Right Hip Extension 4-/5   Right Hip ABduction 3/5   Right Hip ADduction 4+/5   Left Hip Flexion 3+/5  pain during testing   Left Hip Extension 4-/5   Left Hip ABduction 3/5   Left Hip ADduction 4+/5   Right/Left Knee Right;Left   Right Knee Flexion 4+/5   Right Knee Extension 4+/5   Left Knee Flexion 3+/5   Left Knee Extension 4/5   Palpation   Spinal mobility hypomobility of L1-L5 with pain upon assessment   SI assessment  L SIJ tender with posterilroy rotated L innomated compared bil   Palpation comment L SIJ pain with referral to the lateral hip region and bil paraspinal spasm    Special Tests    Special Tests Lumbar;Sacrolliac Tests   Lumbar Tests Slump Test;Prone Knee Bend Test;Straight Leg Raise   Sacroiliac Tests  Sacral Thrust  forward flexion test postive for L posterior rotation   Slump test   Findings Negative   Comment pain at the [facility] during assessment   Prone Knee Bend Test   Findings Positive   Side Left   Straight Leg Raise   Findings Positive   Side  Left   Comment ipsliateral pain noted at the the L SIJ    Sacral thrust    Findings Positive   Side Left   Ambulation/Gait   Gait Pattern Step-through pattern;Decreased arm swing - right;Decreased arm swing -  left;Decreased stride length;Decreased trunk rotation;Trunk flexed;Antalgic                           PT Education - 03/12/16 1747    Education provided Yes   Education Details evaluation findings, POC, Goals, HEP   Person(s) Educated Patient   Methods Explanation   Comprehension Verbalized understanding          PT Short Term Goals - 03/12/16 1758  PT SHORT TERM GOAL #1   Title pt will be I with inital HEP (04/11/2016)   Time 4   Period Weeks   Status New           PT Long Term Goals - 03/12/16 1758    PT LONG TERM GOAL #1   Title pt will be I with all HEP as of last visit (05/07/2016)   Time 8   Period Weeks   Status New   PT LONG TERM GOAL #2   Title pt will improve her hip flexion/ abduction and extension strength to >/= 4-/5 with </= 3/10 pain during testing to assist with ADLs (05/07/2016)   Time 8   Period Weeks   Status New   PT LONG TERM GOAL #3   Title pt will be able to stand/ walk for >/= 30 mintues with </= 3/10 pain to assist with work related activities (05/07/2016)   Time 8   Period Weeks   Status New   PT LONG TERM GOAL #4   Title pt will be able to verbalize proper posture and lifting techniques to reduce and prevent low back pain (05/07/2016)   Time 8   Period Weeks   Status New   PT LONG TERM GOAL #5   Title pt will improve her FOTO score By >/= 10 points to demonstrate improvement in function (05/07/2016)   Time [place] - 03/12/16 1748    Clinical Impression Statement Sunday presents to OPPT as a moderate comlextiy evaluation with CC of Acute exacerbation of chronic L low back pain with referral down the LLE. she demosntrates limited trunk mobility with deviation to the L during flexion/ extension. MMT revealed bil hip weakness with pain during testing with  L compared bil. She exhibits anatalgic gait pattern with limited stride and decreased trunk rotation. palpation was  remarkable for L SIJ pain with referral to the lateral hip region and bil paraspinal spasm. she would benefit from physical therapy to reduce pain, improve trunk mobility, decrease spasm, imporve walking/ standing tolerance and overall maximize her function by addressing the impairments listed.    Rehab Potential Good   PT Frequency 1x / week   PT Duration 8 weeks   PT Treatment/Interventions ADLs/Self Care Home Management;Cryotherapy;Electrical Stimulation;Iontophoresis 107m/ml Dexamethasone;Moist Heat;Therapeutic exercise;Manual techniques;Taping;Dry needling;Passive range of motion;Patient/family education;Therapeutic activities;Ultrasound   PT Next Visit Plan assess/ review HEP, posteriorly rotated L innomnate: streching of Left hamstring, hip flexor MET/ manual if not tolerated MET. hip/core strengthening, modalities PRN. See if pt filled out CAFA or discuss [facility] (pt self pay)   PT Home Exercise Plan hamstring stretching, SLR, lower trunk rotation, pelvic tilts   Consulted and Agree with Plan of Care Patient      Patient will benefit from skilled therapeutic intervention in order to improve the following deficits and impairments:  Pain, Improper body mechanics, Postural dysfunction, Impaired flexibility, Hypomobility, Decreased strength, Decreased mobility, Decreased activity tolerance, Decreased endurance, Difficulty walking, Decreased range of motion, Abnormal gait, Increased muscle spasms  Visit Diagnosis: Left-sided low back pain with left-sided sciatica - Plan: PT plan of care cert/re-cert  Cramp and spasm - Plan: PT plan of care cert/re-cert  Other abnormalities of gait and mobility - Plan: PT plan of care cert/re-cert  Abnormal posture - Plan: PT plan of care cert/re-cert  Muscle weakness (generalized) - Plan: PT plan  of care cert/re-cert     Problem List Patient Active Problem List   Diagnosis Date Noted  . Sickle cell trait ([place]) 02/05/2016  . Essential hypertension  02/05/2016  . Hyperlipidemia 02/05/2016  . Hypothyroidism 02/05/2016  . Vitamin D deficiency 02/05/2016  . Health care maintenance 02/05/2016  . Atrial fibrillation ([place]) 02/05/2016   Starr Lake PT, DPT, LAT, ATC  03/12/2016  6:06 PM      [facility] [address] [place], [place], [address] Phone: [phone]   Fax:  [phone]  Name: Kristi Duncan MRN: [id number] Date of Birth: Aug 24, 1961

## 2016-03-13 ENCOUNTER — Ambulatory Visit: Payer: Self-pay | Admitting: Physical Therapy

## 2016-03-21 ENCOUNTER — Other Ambulatory Visit: Payer: Self-pay | Admitting: Internal Medicine

## 2016-03-21 ENCOUNTER — Ambulatory Visit: Payer: Self-pay

## 2016-03-21 ENCOUNTER — Ambulatory Visit: Payer: Self-pay | Attending: Family Medicine | Admitting: Physical Therapy

## 2016-03-21 ENCOUNTER — Encounter: Payer: Self-pay | Admitting: *Deleted

## 2016-03-21 DIAGNOSIS — M5442 Lumbago with sciatica, left side: Secondary | ICD-10-CM | POA: Insufficient documentation

## 2016-03-21 DIAGNOSIS — R252 Cramp and spasm: Secondary | ICD-10-CM | POA: Insufficient documentation

## 2016-03-21 DIAGNOSIS — M6281 Muscle weakness (generalized): Secondary | ICD-10-CM | POA: Insufficient documentation

## 2016-03-21 DIAGNOSIS — R2689 Other abnormalities of gait and mobility: Secondary | ICD-10-CM | POA: Insufficient documentation

## 2016-03-21 DIAGNOSIS — R293 Abnormal posture: Secondary | ICD-10-CM | POA: Insufficient documentation

## 2016-03-21 MED ORDER — AMLODIPINE BESYLATE 5 MG PO TABS: 5.0000 mg | ORAL_TABLET | Freq: Every day | ORAL | Status: DC

## 2016-03-21 NOTE — Telephone Encounter (Signed)
Medications refilled

## 2016-03-21 NOTE — Telephone Encounter (Signed)
Pt called and needs a refill on her Amlodipine sent in. jw

## 2016-03-21 NOTE — Therapy (Signed)
[place], [place], [address] Phone: [phone]   Fax:  [phone]  Physical Therapy Treatment  Patient Details  Name: Kristi Duncan MRN: [id number] Date of Birth: February 28, 1961 Referring Provider: Caren Macadam, MD  Encounter Date: 03/21/2016      PT End of Session - 03/21/16 1100    Visit Number 2   Number of Visits 8   Date for PT Re-Evaluation 05/07/16   Authorization Type Self Pay: Reassess at 4th visit on progress and financial assist/ [facility] due to self pay   PT Start Time 1015   PT Stop Time 1106   PT Time Calculation (min) 51 min   Activity Tolerance Patient tolerated treatment well   Behavior During Therapy [facility] for tasks assessed/performed      Past Medical History  Diagnosis Date  . Hypertension   . Hyperlipidemia   . Thyroid disease   . A-fib ([place])   . Thyroid disease   . Chronic back pain     Past Surgical History  Procedure Laterality Date  . Cholecystectomy  1992  . Abdominal hysterectomy  2008    There were no vitals filed for this visit.      Subjective Assessment - 03/21/16 1021    Subjective pt reports being consistent with her HEP    Currently in Pain? Yes   Pain Score 1    Pain Location Back   Pain Orientation Left;Lower   Pain Descriptors / Indicators Aching   Pain Onset More than a month ago   Pain Frequency Intermittent   Aggravating Factors  bending forward/ bending down   Pain Relieving Factors tylenol and laying down                         [facility] Adult PT Treatment/Exercise - 03/21/16 1025    Self-Care   Self-Care Posture   Posture posture education, with lifting and carrying mechanics    Lumbar Exercises: Stretches   Active Hamstring Stretch 2 reps;30 seconds  with strap   Lower Trunk Rotation 3 reps;20 seconds   Pelvic Tilt 3 reps;20 seconds  cues to tighten core   Lumbar Exercises: Aerobic   Stationary Bike L5 x  5 min Using UE/LE  Nu-Step    Lumbar Exercises: Supine   Straight Leg Raise 10 reps  2 sets on L le                PT Education - 03/21/16 1059    Education provided Yes   Education Details posture education handout, HEP review   Person(s) Educated Patient   Methods Explanation   Comprehension Verbalized understanding          PT Short Term Goals - 03/12/16 1758    PT SHORT TERM GOAL #1   Title pt will be I with inital HEP (04/11/2016)   Time 4   Period Weeks   Status New           PT Long Term Goals - 03/12/16 1758    PT LONG TERM GOAL #1   Title pt will be I with all HEP as of last visit (05/07/2016)   Time 8   Period Weeks   Status New   PT LONG TERM GOAL #2   Title pt will improve her hip flexion/ abduction and extension strength to >/= 4-/5 with </= 3/10 pain during testing to assist with ADLs (05/07/2016)   Time  8   Period Weeks   Status New   PT LONG TERM GOAL #3   Title pt will be able to stand/ walk for >/= 30 mintues with </= 3/10 pain to assist with work related activities (05/07/2016)   Time 8   Period Weeks   Status New   PT LONG TERM GOAL #4   Title pt will be able to verbalize proper posture and lifting techniques to reduce and prevent low back pain (05/07/2016)   Time 8   Period Weeks   Status New   PT LONG TERM GOAL #5   Title pt will improve her FOTO score By >/= 10 points to demonstrate improvement in function (05/07/2016)   Time 8   Period Weeks   Status New               Plan - 03/21/16 1100    Clinical Impression Statement Mrs. Sebastiano reports being consistent with her HEP and that her pain is down to 1/10 today. reviewed HEP and posture education. Following hamstring stretching and SLR she rpeorted decreased pinching in the low back. utilized MHP post session to decrease soreness.    PT Next Visit Plan posteriorly rotated L innomnate: streching of Left hamstring, hip flexor MET/ manual if not tolerated MET. hip/core  strengthening, modalities PRN. See if pt filled out CAFA or discuss [place] [facility] (pt self pay)   Consulted and Agree with Plan of Care Patient      Patient will benefit from skilled therapeutic intervention in order to improve the following deficits and impairments:  Pain, Improper body mechanics, Postural dysfunction, Impaired flexibility, Hypomobility, Decreased strength, Decreased mobility, Decreased activity tolerance, Decreased endurance, Difficulty walking, Decreased range of motion, Abnormal gait, Increased muscle spasms  Visit Diagnosis: Left-sided low back pain with left-sided sciatica  Cramp and spasm  Other abnormalities of gait and mobility  Abnormal posture  Muscle weakness (generalized)     Problem List Patient Active Problem List   Diagnosis Date Noted  . Sickle cell trait ([place]) 02/05/2016  . Essential hypertension 02/05/2016  . Hyperlipidemia 02/05/2016  . Hypothyroidism 02/05/2016  . Vitamin D deficiency 02/05/2016  . Health care maintenance 02/05/2016  . Atrial fibrillation ([place]) 02/05/2016   Starr Lake PT, DPT, LAT, ATC  03/21/2016  11:03 AM      [facility] [address] [place], [place], [address] Phone: [phone]   Fax:  [phone]  Name: ALTOVISE WAHLER MRN: [id number] Date of Birth: February 07, 1961

## 2016-03-21 NOTE — Patient Instructions (Signed)

## 2016-03-26 ENCOUNTER — Ambulatory Visit: Payer: Self-pay

## 2016-03-29 ENCOUNTER — Ambulatory Visit: Payer: Self-pay | Admitting: Physical Therapy

## 2016-03-29 DIAGNOSIS — R293 Abnormal posture: Secondary | ICD-10-CM

## 2016-03-29 DIAGNOSIS — M5442 Lumbago with sciatica, left side: Secondary | ICD-10-CM

## 2016-03-29 DIAGNOSIS — M6281 Muscle weakness (generalized): Secondary | ICD-10-CM

## 2016-03-29 DIAGNOSIS — R252 Cramp and spasm: Secondary | ICD-10-CM

## 2016-03-29 DIAGNOSIS — R2689 Other abnormalities of gait and mobility: Secondary | ICD-10-CM

## 2016-03-29 NOTE — Therapy (Signed)
[place], [place], [address] Phone: [phone]   Fax:  [phone]  Physical Therapy Treatment  Patient Details  Name: Kristi Duncan MRN: [id number] Date of Birth: January 17, 1961 Referring Provider: Caren Macadam, MD  Encounter Date: 03/29/2016      PT End of Session - 03/29/16 1132    Visit Number 3   Number of Visits 8   Date for PT Re-Evaluation 05/07/16   Authorization Type Self Pay: Reassess at 4th visit on progress and financial assist/ [facility] due to self pay   PT Start Time 1016   PT Stop Time 1105   PT Time Calculation (min) 49 min   Activity Tolerance Patient tolerated treatment well   Behavior During Therapy [facility] for tasks assessed/performed      Past Medical History  Diagnosis Date  . Hypertension   . Hyperlipidemia   . Thyroid disease   . A-fib ([place])   . Thyroid disease   . Chronic back pain     Past Surgical History  Procedure Laterality Date  . Cholecystectomy  1992  . Abdominal hysterectomy  2008    There were no vitals filed for this visit.      Subjective Assessment - 03/29/16 1018    Subjective "I am doing alright, I am have been kind of achey seems to be weather related"   Currently in Pain? Yes   Pain Score 1    Pain Orientation Left;Lower   Pain Descriptors / Indicators Aching   Pain Onset More than a month ago   Aggravating Factors  nothing particular   Pain Relieving Factors sitting and resting                         [facility] Adult PT Treatment/Exercise - 03/29/16 1023    Lumbar Exercises: Stretches   Active Hamstring Stretch 2 reps;30 seconds   Single Knee to Chest Stretch 2 reps;30 seconds   Lower Trunk Rotation 3 reps;10 seconds   Lumbar Exercises: Aerobic   Stationary Bike L5 x 6 min Using UE/ LE   Lumbar Exercises: Standing   Other Standing Lumbar Exercises gait training 5 x 20 ft using walking sticks to facilitate trunk  rotaiton and arm swing   Modalities   Modalities Moist Heat   Moist Heat Therapy   Number Minutes Moist Heat 10 Minutes   Moist Heat Location Lumbar Spine  in supine                PT Education - 03/29/16 1203    Education provided Yes   Education Details gait training with arm swing and trunk rotaition   Person(s) Educated Patient   Methods Explanation   Comprehension Verbalized understanding          PT Short Term Goals - 03/29/16 1209    PT SHORT TERM GOAL #1   Title pt will be I with inital HEP (04/11/2016)   Time 4   Period Weeks   Status On-going           PT Long Term Goals - 03/12/16 1758    PT LONG TERM GOAL #1   Title pt will be I with all HEP as of last visit (05/07/2016)   Time 8   Period Weeks   Status New   PT LONG TERM GOAL #2   Title pt will improve her hip flexion/ abduction and extension strength to >/= 4-/5 with </=  3/10 pain during testing to assist with ADLs (05/07/2016)   Time 8   Period Weeks   Status New   PT LONG TERM GOAL #3   Title pt will be able to stand/ walk for >/= 30 mintues with </= 3/10 pain to assist with work related activities (05/07/2016)   Time 8   Period Weeks   Status New   PT LONG TERM GOAL #4   Title pt will be able to verbalize proper posture and lifting techniques to reduce and prevent low back pain (05/07/2016)   Time 8   Period Weeks   Status New   PT LONG TERM GOAL #5   Title pt will improve her FOTO score By >/= 10 points to demonstrate improvement in function (05/07/2016)   Time [place] - 03/29/16 1205    Clinical Impression Statement Mrs. Grosser continues to make progress with PT reporting decreased pain and improved function with mobility especially getting into and out of bed. Focused on stretching of the trunk/ hip musculatur and strengthening of the hips in standing. practiced gait training, pt required verbal and tactile cues for arm swing and  trunk rotation.    PT Next Visit Plan posteriorly rotated L innomnate: streching of Left hamstring, hip flexor MET/ manual if not tolerated MET. hip/core strengthening, modalities PRN. See if pt filled out CAFA or discuss [place] [facility] (pt self pay), gait training for trunk rotation and arm swing   PT Home Exercise Plan standing hip abduction/ extension,  single knee to chest   Consulted and Agree with Plan of Care Patient      Patient will benefit from skilled therapeutic intervention in order to improve the following deficits and impairments:  Pain, Improper body mechanics, Postural dysfunction, Impaired flexibility, Hypomobility, Decreased strength, Decreased mobility, Decreased activity tolerance, Decreased endurance, Difficulty walking, Decreased range of motion, Abnormal gait, Increased muscle spasms  Visit Diagnosis: Left-sided low back pain with left-sided sciatica  Cramp and spasm  Other abnormalities of gait and mobility  Abnormal posture  Muscle weakness (generalized)     Problem List Patient Active Problem List   Diagnosis Date Noted  . Sickle cell trait ([place]) 02/05/2016  . Essential hypertension 02/05/2016  . Hyperlipidemia 02/05/2016  . Hypothyroidism 02/05/2016  . Vitamin D deficiency 02/05/2016  . Health care maintenance 02/05/2016  . Atrial fibrillation ([place]) 02/05/2016   Starr Lake PT, DPT, LAT, ATC  03/29/2016  12:19 PM      [facility] [address] [place], [place], [address] Phone: [phone]   Fax:  [phone]  Name: Kristi Duncan MRN: [id number] Date of Birth: 07/22/1961

## 2016-04-02 ENCOUNTER — Telehealth: Payer: Self-pay | Admitting: Internal Medicine

## 2016-04-02 DIAGNOSIS — Z0000 Encounter for general adult medical examination without abnormal findings: Secondary | ICD-10-CM

## 2016-04-02 NOTE — Telephone Encounter (Signed)
Left message for patient informing of her dentist referral and to call with any questions.

## 2016-04-02 NOTE — Telephone Encounter (Signed)
Pt called and needs a referral for the dentist. She does have the orange card so she wants to start the process ASAP since she lost another tooth and it takes a while to get an appointment. jw

## 2016-04-02 NOTE — Telephone Encounter (Signed)
Please let Kristi Duncan know that her referral to dentistry has been placed. She should hear from our office in the next two weeks to schedule this appointment. Thank you!

## 2016-04-02 NOTE — Telephone Encounter (Signed)
Will forward to MD. Jazmin Hartsell,CMA  

## 2016-04-04 ENCOUNTER — Ambulatory Visit: Payer: Self-pay | Admitting: Physical Therapy

## 2016-04-04 DIAGNOSIS — M5442 Lumbago with sciatica, left side: Secondary | ICD-10-CM

## 2016-04-04 DIAGNOSIS — R2689 Other abnormalities of gait and mobility: Secondary | ICD-10-CM

## 2016-04-04 DIAGNOSIS — R252 Cramp and spasm: Secondary | ICD-10-CM

## 2016-04-04 DIAGNOSIS — R293 Abnormal posture: Secondary | ICD-10-CM

## 2016-04-04 DIAGNOSIS — M6281 Muscle weakness (generalized): Secondary | ICD-10-CM

## 2016-04-04 NOTE — Therapy (Signed)
[facility] [address] [place], [place], [address] Phone: [phone]   Fax:  [phone]  Physical Therapy Treatment  Patient Details  Name: Kristi Duncan MRN: [id number] Date of Birth: 09-18-1961 Referring Provider: Federico Flake, MD  Encounter Date: 04/04/2016      Duncan End of Session - 04/04/16 1023    Visit Number 4   Number of Visits 8   Date for Duncan Re-Evaluation 05/07/16   Authorization Type CAFA   Duncan Start Time 1015   Duncan Stop Time 1108   Duncan Time Calculation (min) 53 min   Activity Tolerance Patient tolerated treatment well   Behavior During Therapy [facility] for tasks assessed/performed      Past Medical History  Diagnosis Date  . Hypertension   . Hyperlipidemia   . Thyroid disease   . A-fib (HCC)   . Thyroid disease   . Chronic back pain     Past Surgical History  Procedure Laterality Date  . Cholecystectomy  1992  . Abdominal hysterectomy  2008    There were no vitals filed for this visit.      Subjective Assessment - 04/04/16 1020    Subjective " I have been practicing my walking, and pain is just a slight ache like a .5"   Currently in Pain? Yes   Pain Score --  Duncan reported its a .5   Pain Location Back   Pain Orientation Left;Lower   Pain Descriptors / Indicators Aching   Pain Onset More than a month ago   Pain Frequency Intermittent   Aggravating Factors  Nothing    Pain Relieving Factors sitting, resting, posture awareness            [facility] Duncan Assessment - 04/04/16 1027    AROM   Lumbar Flexion 70   Lumbar Extension 18   Lumbar - Right Side Bend 28   Lumbar - Left Side Bend 20                     [facility] Adult Duncan Treatment/Exercise - 04/04/16 1029    Lumbar Exercises: Stretches   Lower Trunk Rotation 2 reps;10 seconds   Lumbar Exercises: Aerobic   Stationary Bike L5 x 5 min  using LE only   Lumbar Exercises: Standing   Other Standing Lumbar Exercises step  ups on 6 inch step 2 x 10, step downs 2 x 10,    Lumbar Exercises: Supine   Bent Knee Raise 10 reps  2 x with verbal cues for ADIM   Bridge 10 reps;2 seconds  x 2 sets   Straight Leg Raise 15 reps  x 2 sets, just touching mat to improve endurance   Other Supine Lumbar Exercises dead bug 4 x 10 sec hold  demonstrated fatigue, with slow drop of hips   Lumbar Exercises: Prone   Single Arm Raise 10 reps  alternating between L/R on stomach, Verbal cues for ADIM   Straight Leg Raise 10 reps  alternating between L and R on stomach   Opposite Arm/Leg Raise 10 reps;Right arm/Left leg;Left arm/Right leg  verbal cues for ADIM   Moist Heat Therapy   Number Minutes Moist Heat 10 Minutes   Moist Heat Location Lumbar Spine  supine                  Duncan Short Term Goals - 03/29/16 1209    Duncan SHORT TERM GOAL #1   Title Duncan will  be I with inital HEP (04/11/2016)   Time 4   Period Weeks   Status On-going           Duncan Long Term Goals - 03/12/16 1758    Duncan LONG TERM GOAL #1   Title Duncan will be I with all HEP as of last visit (05/07/2016)   Time 8   Period Weeks   Status New   Duncan LONG TERM GOAL #2   Title Duncan will improve her hip flexion/ abduction and extension strength to >/= 4-/5 with </= 3/10 pain during testing to assist with ADLs (05/07/2016)   Time 8   Period Weeks   Status New   Duncan LONG TERM GOAL #3   Title Duncan will be able to stand/ walk for >/= 30 mintues with </= 3/10 pain to assist with work related activities (05/07/2016)   Time 8   Period Weeks   Status New   Duncan LONG TERM GOAL #4   Title Duncan will be able to verbalize proper posture and lifting techniques to reduce and prevent low back pain (05/07/2016)   Time 8   Period Weeks   Status New   Duncan LONG TERM GOAL #5   Title Duncan will improve her FOTO score By >/= 10 points to demonstrate improvement in function (05/07/2016)   Time 8   Period Weeks   Status New               Plan - 04/04/16 1108    Clinical  Impression Statement Kristi Duncan has demonstrated improvement in trunk mobility with decreased pain since evaluation. Focsued todays session in hip and core strengthening which she performed well with no report of increased pain.    Duncan Next Visit Plan core and hip strengthening, add prone bird dogs to HEP, and wall squats, Stretching of hamstrings / glutes and low back   Consulted and Agree with Plan of Care Patient      Patient will benefit from skilled therapeutic intervention in order to improve the following deficits and impairments:  Pain, Improper body mechanics, Postural dysfunction, Impaired flexibility, Hypomobility, Decreased strength, Decreased mobility, Decreased activity tolerance, Decreased endurance, Difficulty walking, Decreased range of motion, Abnormal gait, Increased muscle spasms  Visit Diagnosis: Left-sided low back pain with left-sided sciatica  Cramp and spasm  Other abnormalities of gait and mobility  Abnormal posture  Muscle weakness (generalized)     Problem List Patient Active Problem List   Diagnosis Date Noted  . Sickle cell trait (HCC) 02/05/2016  . Essential hypertension 02/05/2016  . Hyperlipidemia 02/05/2016  . Hypothyroidism 02/05/2016  . Vitamin D deficiency 02/05/2016  . Health care maintenance 02/05/2016  . Atrial fibrillation (HCC) 02/05/2016   Kristi Duncan, DPT, LAT, ATC  04/04/2016  11:12 AM      [facility] [address] [place], [place], [address] Phone: [phone]   Fax:  [phone]  Name: Kristi Duncan MRN: [id number] Date of Birth: [birth year]

## 2016-04-08 ENCOUNTER — Encounter: Payer: Self-pay | Admitting: Internal Medicine

## 2016-04-08 ENCOUNTER — Ambulatory Visit (INDEPENDENT_AMBULATORY_CARE_PROVIDER_SITE_OTHER): Payer: Self-pay | Admitting: Internal Medicine

## 2016-04-08 VITALS — BP 122/70 | HR 64 | Temp 98.0°F | Ht 68.0 in | Wt 228.0 lb

## 2016-04-08 DIAGNOSIS — M5440 Lumbago with sciatica, unspecified side: Secondary | ICD-10-CM

## 2016-04-08 DIAGNOSIS — M545 Low back pain, unspecified: Secondary | ICD-10-CM | POA: Insufficient documentation

## 2016-04-08 DIAGNOSIS — Z0000 Encounter for general adult medical examination without abnormal findings: Secondary | ICD-10-CM

## 2016-04-08 DIAGNOSIS — I10 Essential (primary) hypertension: Secondary | ICD-10-CM

## 2016-04-08 DIAGNOSIS — E038 Other specified hypothyroidism: Secondary | ICD-10-CM

## 2016-04-08 NOTE — Assessment & Plan Note (Signed)
Used to take Synthroid daily, but ran out of prescription and TSH was normal. - Would like to check TSH, T4 today, but Pt does not have insurance and states she cannot afford this. - Will continue to monitor. - May need to check TSH in the future if she develops symptoms related to hypothyroidism

## 2016-04-08 NOTE — Assessment & Plan Note (Signed)
Improved. Currently rates pain as 1-2/10.  - Continue physical therapy - Continue Tylenol prn

## 2016-04-08 NOTE — Progress Notes (Signed)
[facility] Phone: [phone]  Subjective:  Back pain: Pt here for follow-up of low back pain. Seen on 4/17 by Dr. Alvester MorinNewton for back pain. Was referred to physical therapy at that time. Physical therapy has been helping a lot. She has been doing her PT exercises at home. The pain has decreased since starting physical therapy and is currently a 1-2/10. The pain is a dull ache "like a toothache" and is constant. The pain is located on the left side of her back and radiates down the back of her left leg. 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 She has been using Tylenol extra strength, which has been working. No fevers, no chills, no recent weight loss.  Hypothyroidism: Has been on Synthroid in the past but stopped taking it for ~2 months when her prescription ran out. TSH and T4 were rechecked after being off Synthroid and were normal. Synthroid was not restarted. No weight gain, no dry skin. She endorses occasional constipation and fatigue.  Hypertension: Currently taking Atenolol 25mg  daily and Norvasc 5mg  daily. Does not check her BPs at home. She is tolerating the medications without any difficulty.  No chest pain. No shortness of breath. No lower extremity edema.  Healthcare Maintenance: Has just recently obtained the orange card. Has a referral pending to Dentistry for routine care. She has never had a colonoscopy done. She would like a referral today. She is also overdue for a mammogram and HIV/Hep C screening, but the [facility] does not [facility] these.  Work Paperwork: Pt needs PPD for work and paperwork filled about regarding her health and how that may affect her work.  ROS: See HPI for pertinent positives and negatives Past Medical History- HTN, HLD, hypothyroidism, low back pain, pAfib Reviewed problem list.  Medications- reviewed and updated Current Outpatient Prescriptions  Medication  Sig Dispense Refill  . acetaminophen (TYLENOL) 500 MG tablet Take 500 mg by mouth every 6 (six) hours as needed for mild pain, moderate pain, fever or headache.    Marland Kitchen. amLODipine (NORVASC) 5 MG tablet Take 1 tablet (5 mg total) by mouth daily. 30 tablet 5  . atenolol (TENORMIN) 25 MG tablet Take 25 mg by mouth daily.    Marland Kitchen. diltiazem (CARDIZEM) 120 MG tablet Take 1 tablet (120 mg total) by mouth 2 (two) times daily as needed (for fast heart rate). 10 tablet 1  . HYDROmorphone (DILAUDID) 2 MG tablet Take 1 tablet (2 mg total) by mouth every 6 (six) hours as needed for severe pain. (Patient taking differently: Take 2 mg by mouth every 6 (six) hours as needed for severe pain (only taking as needed for pain). ) 10 tablet 0   No current facility-administered medications for this visit.   Chief complaint-noted Family history reviewed for today's visit. No changes. Social history- patient is a former smoker.  Objective: BP 122/70 mmHg  Pulse 64  Temp(Src) 98 F (36.7 C) (Oral)  Ht 5\' 8"  (1.727 m)  Wt 228 lb (103.42 kg)  BMI 34.68 kg/m2 Gen: NAD, alert, cooperative with exam HEENT: NCAT, EOMI, MMM Neck: FROM, supple CV: RRR, no murmur Resp: CTABL, no wheezes, normal work of breathing GI: SNTND, BS present, no guarding or organomegaly Msk: No edema, warm, normal tone, moves UE/LE spontaneously; no tenderness to palpation of the spinous processes of the lumbar spine; tenderness present over the left paraspinal muscles and over the  left SI joint. Neuro: Alert and oriented, no gross deficits, SLR negative bilaterally, 5/5 strength in the lower extremities, normal gait. Skin: No rashes, no lesions Psych: Appropriate behavior  Assessment/Plan: Low Back Pain:  Improved. Currently rates pain as 1-2/10.  - Continue physical therapy - Continue Tylenol prn  Hypothyroidism:  Used to take Synthroid daily, but ran out of prescription and TSH was normal. - Would like to check TSH, T4 today, but Pt  does not have insurance and states she cannot afford this. - Will continue to monitor. - May need to check TSH in the future if she develops symptoms related to hypothyroidism  Hypertension: BP 142/67, 122/70 on recheck. Goal <140/<90 Taking Atenolol  daily and Amlodipine  daily. No side effects. - Continue current regimen - Follow-up in 3 months  Healthcare Maintenance: - Referred to GI for colonoscopy - Spoke with our referral coordinator. Orange card will not cover mammogram or HIV/Hep C screening. Will hold off on these for now.  Work paperwork: - Paperwork filled out - PPD placed. Pt advised to follow-up in nurse clinic in 2 days for read.  Willadean Carol, MD PGY-1

## 2016-04-08 NOTE — Patient Instructions (Signed)
It was so nice to see you!  I am so glad to hear that your back pain is improving. Please continue to go to physical therapy and use Tylenol as needed.  I have sent in a referral to the gastroenterologist for you to have your colonoscopy. You should hear from our office in about 2 weeks to schedule the colonoscopy.   For your PPD test, please stop by the front desk to schedule a nursing visit in 2 days to have the test read.  You can call [facility] at [phone] to see how much the Amlodipine costs over there. I am happy [facility]   -Dr. Nancy MarusMayo

## 2016-04-08 NOTE — Assessment & Plan Note (Signed)
BP 142/67, 122/70 on recheck. Goal <140/<90 Taking Atenolol 25mg  daily and Amlodipine 5mg  daily. No side effects. - Continue current regimen - Follow-up in 3 months

## 2016-04-08 NOTE — Assessment & Plan Note (Signed)
-   Referred to GI for colonoscopy - Spoke with our referral coordinator. Orange card will not cover mammogram or HIV/Hep C screening. Will hold off on these for now.

## 2016-04-10 ENCOUNTER — Ambulatory Visit (INDEPENDENT_AMBULATORY_CARE_PROVIDER_SITE_OTHER): Payer: Self-pay | Admitting: *Deleted

## 2016-04-10 DIAGNOSIS — Z7689 Persons encountering health services in other specified circumstances: Secondary | ICD-10-CM

## 2016-04-10 DIAGNOSIS — Z111 Encounter for screening for respiratory tuberculosis: Secondary | ICD-10-CM

## 2016-04-10 LAB — TB SKIN TEST
Induration: 0 mm
TB Skin Test: NEGATIVE

## 2016-04-10 NOTE — Progress Notes (Signed)
   PPD Reading Note PPD read and results entered in EpicCare. Result: 0 mm induration. Interpretation: Negative If test not read within 48-72 hours of initial placement, patient advised to repeat in other arm 1-3 weeks after this test. Allergic reaction: no  Martin, Tamika L, RN  

## 2016-04-11 ENCOUNTER — Ambulatory Visit: Payer: Self-pay | Admitting: Physical Therapy

## 2016-04-11 DIAGNOSIS — R2689 Other abnormalities of gait and mobility: Secondary | ICD-10-CM

## 2016-04-11 DIAGNOSIS — M5442 Lumbago with sciatica, left side: Secondary | ICD-10-CM

## 2016-04-11 DIAGNOSIS — R252 Cramp and spasm: Secondary | ICD-10-CM

## 2016-04-11 DIAGNOSIS — R293 Abnormal posture: Secondary | ICD-10-CM

## 2016-04-11 DIAGNOSIS — M6281 Muscle weakness (generalized): Secondary | ICD-10-CM

## 2016-04-11 NOTE — Therapy (Signed)
[facility] [address] [place], [place], [address] Phone: [phone]   Fax:  [phone]  Physical Therapy Treatment  Patient Details  Name: Kristi Duncan MRN: [id number] Date of Birth: 08/02/1961 Referring Provider: Federico Flake, MD  Encounter Date: 04/11/2016      PT End of Session - 04/11/16 1106    Visit Number 5   Number of Visits 8   Date for PT Re-Evaluation 05/07/16   Authorization Type CAFA   PT Start Time 1018   PT Stop Time 1108   PT Time Calculation (min) 50 min   Activity Tolerance Patient tolerated treatment well   Behavior During Therapy [facility] for tasks assessed/performed      Past Medical History  Diagnosis Date  . Hypertension   . Hyperlipidemia   . Thyroid disease   . A-fib (HCC)   . Thyroid disease   . Chronic back pain     Past Surgical History  Procedure Laterality Date  . Cholecystectomy  1992  . Abdominal hysterectomy  2008    There were no vitals filed for this visit.      Subjective Assessment - 04/11/16 1018    Subjective "I had more soreness last night and tossed and turned but am doing better today"   Currently in Pain? Yes   Pain Score 1    Pain Location Back   Pain Orientation Left;Lower   Pain Descriptors / Indicators Sore;Aching   Pain Type Chronic pain   Pain Onset More than a month ago   Pain Frequency Intermittent   Aggravating Factors  weather,    Pain Relieving Factors sitting, resting                         [facility] Adult PT Treatment/Exercise - 04/11/16 1021    Lumbar Exercises: Aerobic   Stationary Bike L5 x 8 min  LE only, to progress with endurance   Lumbar Exercises: Supine   Bent Knee Raise 10 reps  x 2 sets   Bridge 10 reps   Straight Leg Raise 10 reps  2 x 10 2#   Other Supine Lumbar Exercises dead bug 4 x 10 sec hold                PT Education - 04/11/16 1105    Education provided Yes   Education Details  hip anatomy in regard to muscle origin/insertion of hamstrings and glutes and effect on rotation and referral pattern for glute med/ min pain. benefits of manual trigger point release for glute medius.    Person(s) Educated Patient   Methods Explanation;Demonstration   Comprehension Verbalized understanding          PT Short Term Goals - 04/11/16 1114    PT SHORT TERM GOAL #1   Title pt will be I with inital HEP (04/11/2016)   Time 4   Period Weeks   Status Achieved           PT Long Term Goals - 04/11/16 1115    PT LONG TERM GOAL #1   Title pt will be I with all HEP as of last visit (05/07/2016)   Time 8   Period Weeks   Status On-going   PT LONG TERM GOAL #2   Title pt will improve her hip flexion/ abduction and extension strength to >/= 4-/5 with </= 3/10 pain during testing to assist with ADLs (05/07/2016)   Time 8  Period Weeks   Status On-going   PT LONG TERM GOAL #3   Title pt will be able to stand/ walk for >/= 30 mintues with </= 3/10 pain to assist with work related activities (05/07/2016)   Time 8   Period Weeks   Status On-going   PT LONG TERM GOAL #4   Title pt will be able to verbalize proper posture and lifting techniques to reduce and prevent low back pain (05/07/2016)   Time 8   Period Weeks   Status On-going   PT LONG TERM GOAL #5   Title pt will improve her FOTO score By >/= 10 points to demonstrate improvement in function (05/07/2016)   Time 8   Period Weeks   Status Unable to assess               Plan - 04/11/16 1106    Clinical Impression Statement Mrs. Schrimpf continues to report decreased pain upon arrival to therapy compared to pain in the AM. upon further assessment she demonsrates multiple trigger points in the l glute mediues with referral down the L leg with direct pressure/ treatment. following manual and IASTM she reported soreness but was able to peform exercises with no increase in soreness but did report increased fatigue. MHP  post session on low back and L lateral hip.    PT Next Visit Plan core and hip strengthening increase reps/sets for endurance, add prone bird dogs to HEP, and wall squats, Stretching of hamstrings / glutes and low back, measure, goals, and send progress note to MD   PT Home Exercise Plan continue   Consulted and Agree with Plan of Care Patient      Patient will benefit from skilled therapeutic intervention in order to improve the following deficits and impairments:  Pain, Improper body mechanics, Postural dysfunction, Impaired flexibility, Hypomobility, Decreased strength, Decreased mobility, Decreased activity tolerance, Decreased endurance, Difficulty walking, Decreased range of motion, Abnormal gait, Increased muscle spasms  Visit Diagnosis: Left-sided low back pain with left-sided sciatica  Cramp and spasm  Other abnormalities of gait and mobility  Abnormal posture  Muscle weakness (generalized)     Problem List Patient Active Problem List   Diagnosis Date Noted  . Low back pain 04/08/2016  . Sickle cell trait (HCC) 02/05/2016  . Essential hypertension 02/05/2016  . Hyperlipidemia 02/05/2016  . Hypothyroidism 02/05/2016  . Vitamin D deficiency 02/05/2016  . Health care maintenance 02/05/2016  . Atrial fibrillation (HCC) 02/05/2016   Kristi RidingKristoffer Grayson Duncan PT, DPT, LAT, ATC  04/11/2016  11:16 AM      [facility] [address] [place], [place], [address] Phone: [phone]   Fax:  [phone]  Name: Kristi EasternMazie F Duncan MRN: [id number] Date of Birth: 05/17/1961

## 2016-05-02 ENCOUNTER — Ambulatory Visit: Payer: Self-pay | Attending: Family Medicine | Admitting: Physical Therapy

## 2016-05-02 DIAGNOSIS — R2689 Other abnormalities of gait and mobility: Secondary | ICD-10-CM | POA: Insufficient documentation

## 2016-05-02 DIAGNOSIS — M6281 Muscle weakness (generalized): Secondary | ICD-10-CM | POA: Insufficient documentation

## 2016-05-02 DIAGNOSIS — M5442 Lumbago with sciatica, left side: Secondary | ICD-10-CM | POA: Insufficient documentation

## 2016-05-02 DIAGNOSIS — R293 Abnormal posture: Secondary | ICD-10-CM | POA: Insufficient documentation

## 2016-05-02 DIAGNOSIS — R252 Cramp and spasm: Secondary | ICD-10-CM | POA: Insufficient documentation

## 2016-05-02 NOTE — Therapy (Signed)
[place], [place], [address] Phone: [phone]   Fax:  [phone]  Physical Therapy Treatment / Discharge Note  Patient Details  Name: Kristi Duncan MRN: [id number] Date of Birth: 1961-08-15 Referring Provider: Caren Macadam, MD  Encounter Date: 05/02/2016      PT End of Session - 05/02/16 1151    Visit Number 6   Number of Visits 8   Date for PT Re-Evaluation 05/07/16   Authorization Type CAFA   PT Start Time 1100   PT Stop Time 1132   PT Time Calculation (min) 32 min   Activity Tolerance Patient tolerated treatment well   Behavior During Therapy [facility] for tasks assessed/performed      Past Medical History  Diagnosis Date  . Hypertension   . Hyperlipidemia   . Thyroid disease   . A-fib ([place])   . Thyroid disease   . Chronic back pain     Past Surgical History  Procedure Laterality Date  . Cholecystectomy  1992  . Abdominal hysterectomy  2008    There were no vitals filed for this visit.      Subjective Assessment - 05/02/16 1107    Subjective "I have only 1/10 but it stays that way all the time and I haven't had to take any tylenol"    Currently in Pain? Yes   Pain Score 1    Pain Location Back   Pain Orientation Lower;Left   Pain Descriptors / Indicators Sore   Pain Type Chronic pain   Pain Onset More than a month ago   Pain Frequency Intermittent            [facility] PT Assessment - 05/02/16 0001    Observation/Other Assessments   Focus on Therapeutic Outcomes (FOTO)  39% limited   AROM   Lumbar Flexion 100   Lumbar Extension 30   Lumbar - Right Side Bend 28   Lumbar - Left Side Bend 28   Strength   Right Hip Flexion 4+/5   Right Hip Extension 4/5   Right Hip ABduction 3+/5   Right Hip ADduction 4+/5   Left Hip Flexion 4-/5   Left Hip Extension 4/5   Left Hip ABduction 3+/5   Left Hip ADduction 4+/5   Right Knee Flexion 4+/5   Right Knee Extension 4+/5    Left Knee Flexion 4-/5   Left Knee Extension 4/5                     [facility] Adult PT Treatment/Exercise - 05/02/16 0001    Lumbar Exercises: Stretches   Active Hamstring Stretch 2 reps;30 seconds   Single Knee to Chest Stretch 2 reps;30 seconds   Lower Trunk Rotation 2 reps;30 seconds   Piriformis Stretch 2 reps;30 seconds   Lumbar Exercises: Aerobic   Stationary Bike L5 x 10 min   LE to promote endurance   Lumbar Exercises: Supine   Straight Leg Raise 15 reps;1 second  with controlled eccentric lowering 2 sec, without touching   Other Supine Lumbar Exercises dead bug 4 x 10 sec hold  tactile cues for form                PT Education - 05/02/16 1149    Education provided Yes   Education Details HEP review and upated with form and rationale. benefits of continuing HEP to avoid reoccurence of symptoms. Progresss she has made with PT.    Person(s) Educated  Patient   Methods Explanation;Handout;Verbal cues   Comprehension Verbalized understanding          PT Short Term Goals - 04/11/16 1114    PT SHORT TERM GOAL #1   Title pt will be I with inital HEP (04/11/2016)   Time 4   Period Weeks   Status Achieved           PT Long Term Goals - 05/02/16 1137    PT LONG TERM GOAL #1   Title pt will be I with all HEP as of last visit (05/07/2016)   Time 8   Period Weeks   Status Achieved   PT LONG TERM GOAL #2   Title pt will improve her hip flexion/ abduction and extension strength to >/= 4-/5 with </= 3/10 pain during testing to assist with ADLs (05/07/2016)   Time 8   Period Weeks   Status Achieved   PT LONG TERM GOAL #3   Title pt will be able to stand/ walk for >/= 30 mintues with </= 3/10 pain to assist with work related activities (05/07/2016)   Time 8   Period Weeks   Status Achieved   PT LONG TERM GOAL #4   Title pt will be able to verbalize proper posture and lifting techniques to reduce and prevent low back pain (05/07/2016)   Time 8    Period Weeks   Status Achieved   PT LONG TERM GOAL #5   Title pt will improve her FOTO score By >/= 10 points to demonstrate improvement in function (05/07/2016)   Time 8   Period Weeks   Status Partially Met               Plan - 05/02/16 1151    Clinical Impression Statement Kristi Duncan has made great progress with physical therapy improving her trunk mobility in all planes. She also is improving her LE strength. she met all goals this session except for partially meeting LTG #5. pt reports she is able to maintain and progress her current level of funciton independently and will be discharged from PT today.    PT Next Visit Plan D/C   PT Home Exercise Plan HEP review, piriformis stretch   Consulted and Agree with Plan of Care Patient      Patient will benefit from skilled therapeutic intervention in order to improve the following deficits and impairments:  Pain, Improper body mechanics, Postural dysfunction, Impaired flexibility, Hypomobility, Decreased strength, Decreased mobility, Decreased activity tolerance, Decreased endurance, Difficulty walking, Decreased range of motion, Abnormal gait, Increased muscle spasms  Visit Diagnosis: Left-sided low back pain with left-sided sciatica  Cramp and spasm  Other abnormalities of gait and mobility  Abnormal posture  Muscle weakness (generalized)     Problem List Patient Active Problem List   Diagnosis Date Noted  . Low back pain 04/08/2016  . Sickle cell trait (HCC) 02/05/2016  . Essential hypertension 02/05/2016  . Hyperlipidemia 02/05/2016  . Hypothyroidism 02/05/2016  . Vitamin D deficiency 02/05/2016  . Health care maintenance 02/05/2016  . Atrial fibrillation (HCC) 02/05/2016   Lulu Riding PT, DPT, LAT, ATC  05/02/2016  11:55 AM      [facility] [address] [place], [place], [address] Phone: [phone]   Fax:  [phone]  Name: Kristi Duncan MRN: [id number] Date of Birth: [birth year]    PHYSICAL THERAPY DISCHARGE SUMMARY  Visits from Start of Care: 6  Current functional level related to goals /  functional outcomes: FOTO score 61   Remaining deficits: Low back pain rated at 1/10 that she reports she no longer requires constant oral medication to help control/ reduce. Mild weakness of L hip compared bil. Mild limitation withstanding/ walking endurance but pt reports it is improving.    Education / Equipment: HEP, posture education, theraband for strengthening.  Plan: Patient agrees to discharge.  Patient goals were partially met. Patient is being discharged due to meeting the stated rehab goals.  ?????

## 2016-05-09 ENCOUNTER — Encounter: Payer: Self-pay | Admitting: Physical Therapy

## 2016-05-16 ENCOUNTER — Encounter: Payer: Self-pay | Admitting: Physical Therapy

## 2016-05-20 ENCOUNTER — Ambulatory Visit: Payer: Self-pay | Admitting: Physical Therapy

## 2016-05-23 ENCOUNTER — Encounter: Payer: Self-pay | Admitting: Physical Therapy

## 2016-05-27 ENCOUNTER — Ambulatory Visit: Payer: Self-pay | Admitting: Cardiology

## 2016-06-18 ENCOUNTER — Other Ambulatory Visit: Payer: Self-pay | Admitting: *Deleted

## 2016-06-18 MED ORDER — ATENOLOL 25 MG PO TABS: 25.0000 mg | ORAL_TABLET | Freq: Every day | ORAL | 3 refills | Status: DC

## 2016-06-18 MED FILL — ATENOLOL 25 MG TABLET: 25 | 30 days supply | Qty: 30 | Fill #0

## 2016-06-18 NOTE — Telephone Encounter (Addendum)
Covering inbox for Dr. Nancy Marus while she is away.  Refill for Atenolol at [facility] was sent, received fax from [facility] that the manufacturer is on backorder for that medication. Called [facility] outpatient pharmacy and they do have that medication in stock. Called patient to inform her to call [facility] outpatient pharmacy and ask to transfer over prescription.

## 2016-06-18 NOTE — Telephone Encounter (Signed)
Received a fax from [facility] stating that Atenolol is manufacturer back order until end of August.  Please advise.  Clovis Pu, RN

## 2016-07-16 MED FILL — ATENOLOL 25 MG TABLET: 25 | 30 days supply | Qty: 30 | Fill #1

## 2016-08-15 MED FILL — ATENOLOL 25 MG TABLET: 25 | 30 days supply | Qty: 30 | Fill #2

## 2016-09-11 ENCOUNTER — Other Ambulatory Visit: Payer: Self-pay | Admitting: Internal Medicine

## 2016-09-11 MED FILL — ATENOLOL 25 MG TABLET: 25 | 30 days supply | Qty: 30 | Fill #3

## 2016-09-20 LAB — GLUCOSE, POCT (MANUAL RESULT ENTRY): POC Glucose: 169 mg/dl — AB (ref 70–99)

## 2016-09-27 ENCOUNTER — Ambulatory Visit (INDEPENDENT_AMBULATORY_CARE_PROVIDER_SITE_OTHER): Payer: Self-pay | Admitting: Internal Medicine

## 2016-09-27 ENCOUNTER — Encounter: Payer: Self-pay | Admitting: Internal Medicine

## 2016-09-27 VITALS — BP 162/56 | HR 55 | Temp 98.2°F | Wt 235.0 lb

## 2016-09-27 DIAGNOSIS — Z0000 Encounter for general adult medical examination without abnormal findings: Secondary | ICD-10-CM

## 2016-09-27 DIAGNOSIS — I10 Essential (primary) hypertension: Secondary | ICD-10-CM

## 2016-09-27 DIAGNOSIS — R739 Hyperglycemia, unspecified: Secondary | ICD-10-CM

## 2016-09-27 LAB — POCT GLYCOSYLATED HEMOGLOBIN (HGB A1C): HEMOGLOBIN A1C: 5.3

## 2016-09-27 MED ORDER — AMLODIPINE BESYLATE 10 MG PO TABS: 10.0000 mg | ORAL_TABLET | Freq: Every day | ORAL | 2 refills | Status: DC

## 2016-09-27 MED ORDER — ATENOLOL 25 MG PO TABS: 25.0000 mg | ORAL_TABLET | Freq: Every day | ORAL | 3 refills | Status: DC

## 2016-09-27 NOTE — Patient Instructions (Addendum)
It was so nice to see you!  I have increased your Norvasc from 5mg  to 10mg . Please take this once a day. Please check your blood pressures at home. Your goal blood pressure is <140 / <90. Let me know if you start having very low blood pressures or if you start feeling dizzy or lightheaded.  I have also checked a hemoglobin A1c. I will call you with this result.  -Dr. Nancy MarusMayo

## 2016-09-27 NOTE — Assessment & Plan Note (Signed)
BP elevated at recent orange card appointment. BP was 165/77, then 150/65 on recheck. In clinic today, BP was 162/56, then 150/65 on recheck. Taking Norvasc 5mg  daily and Atenolol 25mg  daily at home. - Will increase Norvasc from 5mg  daily to 10mg  daily.  - Pt advised to check BPs at home - Pt will call me if she is having any low BPs or if she feels dizzy or lightheaded - Otherwise, follow-up in 2-3 months

## 2016-09-27 NOTE — Assessment & Plan Note (Signed)
Pt had recent blood sugar of 169 at her orange card appointment. This blood sugar was checked right after she ate a donut and breakfast potatoes. - Will check a hemoglobin A1c

## 2016-09-27 NOTE — Assessment & Plan Note (Signed)
Pt recently had an orange card appointment.  - She has received a flu vaccine for this year - When she officially gets the orange card, she needs dentist, mammogram, colonoscopy, Hep C, HIV

## 2016-09-27 NOTE — Progress Notes (Signed)
   [facility] Phone: [phone]  Subjective:  [name] is a 55 year old female presenting to clinic for follow-up of her HTN and for an elevated blood sugar. Pt states she was getting her orange card renewed last Friday. At that appointment, she got her flu shot and they also checked her blood pressure and blood sugar. Her blood pressure was elevated to 165/77 and then 150/75 on recheck. Her blood sugar was 169, but she had just eaten a donut and breakfast potatoes right before the appointment.  For her HTN, she takes Norvasc 5mg  daily and Atenolol 25mg  daily. She denies any chest pain, shortness of breath, or lower extremity edema. She denies any side effects.  She does not have a diagnosis of diabetes. She denies any polyuria or polydipsia.  ROS: See HPI for pertinent positives and negatives  Past Medical History- HTN, A-fib, hypothyroidism, HLD, Vitamin D deficiency  Family history reviewed for today's visit. No changes.  Social history- patient is a former smoker  Objective: BP (!) 162/56   Pulse (!) 55   Temp 98.2 F (36.8 C) (Oral)   Wt 235 lb (106.6 kg)   SpO2 100%   BMI 35.73 kg/m  Gen: NAD, alert, cooperative with exam CV: RRR, no murmur Resp: CTABL, no wheezes, normal work of breathing Msk: No edema, warm, normal tone, moves UE/LE spontaneously  Assessment/Plan: HTN: BP elevated at recent orange card appointment. BP was 165/77, then 150/65 on recheck. In clinic today, BP was 162/56, then 150/65 on recheck. Taking Norvasc 5mg  daily and Atenolol 25mg  daily at home. - Will increase Norvasc from 5mg  daily to 10mg  daily.  - Pt advised to check BPs at home - Pt will call me if she is having any low BPs or if she feels dizzy or lightheaded - Otherwise, follow-up in 2-3 months  Hyperglycemia: Pt had recent blood sugar of 169 at her orange card appointment. This blood sugar was checked right after she ate a donut and breakfast potatoes. - Will check a  hemoglobin A1c  Health Care Maintenance: Pt recently had an orange card appointment.  - She has received a flu vaccine for this year - When she officially gets the orange card, she needs dentist, mammogram, colonoscopy, Hep C, HIV   Willadean CarolKaty Mayo, MD PGY-2

## 2016-11-18 DIAGNOSIS — I779 Disorder of arteries and arterioles, unspecified: Secondary | ICD-10-CM

## 2016-11-18 HISTORY — DX: Disorder of arteries and arterioles, unspecified: I77.9

## 2017-01-13 ENCOUNTER — Other Ambulatory Visit: Payer: Self-pay | Admitting: Internal Medicine

## 2017-02-10 ENCOUNTER — Other Ambulatory Visit: Payer: Self-pay | Admitting: Internal Medicine

## 2017-04-04 ENCOUNTER — Ambulatory Visit (INDEPENDENT_AMBULATORY_CARE_PROVIDER_SITE_OTHER): Payer: Self-pay | Admitting: Family Medicine

## 2017-04-04 DIAGNOSIS — R200 Anesthesia of skin: Secondary | ICD-10-CM | POA: Insufficient documentation

## 2017-04-04 MED ORDER — GABAPENTIN 300 MG PO CAPS: 300.0000 mg | ORAL_CAPSULE | Freq: Three times a day (TID) | ORAL | 1 refills | Status: DC

## 2017-04-04 NOTE — Assessment & Plan Note (Signed)
Patient presents for evaluation of right arm numbness. Clinically consistent with cervical spine radiculopathy. CVA is in the differential however patient has no other acute/subacute neurologic symptoms. Rotator cuff injury is also in the differential however less likely due to unremarkable MSK exam.  -trial of Gabapentin 300 mg TID -MRI cervical spine -if negative and symptoms persist consider MRI brain and further MSK shoulder evaluation

## 2017-04-04 NOTE — Patient Instructions (Signed)
It was nice to see you today.   Start Gabapentin 300 mg three times per day.  I have ordered an MRI of your neck to look for arthritis/irritated nerve.

## 2017-04-04 NOTE — Progress Notes (Signed)
Subjective:     Patient ID: Kristi Duncan, female   DOB: 02/18/1961, 56 y.o.   MRN: [id number]  HPI  Right arm numbness: Pt presents today with 3 week history or right arm pain, numbness, and tingling. Pain is a constant 5/10. Occasionally it feels like a wave of tingling pain travels down her arm from the top of her right shoulder all the way to her fingers. Has tried tylenol but it provided no relief. She notices a mild increase in symtoms after driving where she is holding her arms in a raised position. Pain has woken her up from sleep most nights. Denies any weakness just that her right arm feels heavy at times. Denies any recent traumas or episodes of overuse.   Review of Systems  Constitutional: Negative for appetite change and fever.  Respiratory: Negative for shortness of breath.   Gastrointestinal: Negative for constipation, diarrhea, nausea and vomiting.  Musculoskeletal: Positive for neck pain. Negative for joint swelling.  Neurological: Positive for light-headedness, numbness and headaches. Negative for facial asymmetry, speech difficulty and weakness.       Objective:   Physical Exam   Blood pressure (!) 148/72, pulse 60, temperature 98 F (36.7 C), temperature source Oral, height 5\' 8"  (1.727 m), weight 242 lb 12.8 oz (110.1 kg), SpO2 99 %.  Gen: Well-appearing female, NAD HEENT: PERRL, EOMI, neck supple, No adenopathy Cardiac: RRR, S1 and S2 present, no murmurs, rubs, gallops Resp: CTAB, normal effort Neuro: alert and oriented x3, CN II-XII intact, no pronator drift of outstretched arms, upper extremity muscle bulk and tone normal, sensation to light touch intact, position sense of fingers intact. Negative for tinel sign or phalen's test. MSK:  Strength full bilaterally, full upper extremity ROM, mild tenderness to palpation along mid superior region of infraspinatus. Negative Hawkins test, Negative open can test,  Assessment/Plan:     Pt presents with numbness and  tingling that travels down her right arm from shoulder to hand without signs of muscle weakness or strain. Pts shows possible signs of cervical radiculopathy and will be sent for an MRI of the cervical spine for conformation. Started on Gabapentin 300 mg TID.

## 2017-04-04 NOTE — Progress Notes (Deleted)
   Subjective:    Patient ID: Kristi F CalloArrie Easternway, female    DOB: [birth year], 56 y.o.   MRN: [id number]  HPI 56 y/o female presents for evaluation of arm numbness/tingling.    Review of Systems     Objective:   Physical Exam There were no vitals taken for this visit.  No previous brain/cervical spine imaging in [facility].       Assessment & Plan:  No problem-specific Assessment & Plan notes found for this encounter.

## 2017-04-05 ENCOUNTER — Inpatient Hospital Stay (HOSPITAL_COMMUNITY)
Admission: EM | Admit: 2017-04-05 | Discharge: 2017-04-07 | DRG: 069 | Disposition: A | Discharge status: Home / Self Care | Payer: Self-pay | Attending: Family Medicine | Admitting: Family Medicine

## 2017-04-05 ENCOUNTER — Other Ambulatory Visit: Payer: Self-pay

## 2017-04-05 ENCOUNTER — Emergency Department (HOSPITAL_COMMUNITY): Payer: Self-pay

## 2017-04-05 ENCOUNTER — Encounter (HOSPITAL_COMMUNITY): Payer: Self-pay | Admitting: Emergency Medicine

## 2017-04-05 DIAGNOSIS — I10 Essential (primary) hypertension: Secondary | ICD-10-CM | POA: Diagnosis present

## 2017-04-05 DIAGNOSIS — M545 Low back pain: Secondary | ICD-10-CM | POA: Diagnosis present

## 2017-04-05 DIAGNOSIS — Z823 Family history of stroke: Secondary | ICD-10-CM

## 2017-04-05 DIAGNOSIS — D573 Sickle-cell trait: Secondary | ICD-10-CM | POA: Diagnosis present

## 2017-04-05 DIAGNOSIS — G9340 Encephalopathy, unspecified: Secondary | ICD-10-CM | POA: Diagnosis present

## 2017-04-05 DIAGNOSIS — G459 Transient cerebral ischemic attack, unspecified: Principal | ICD-10-CM | POA: Diagnosis present

## 2017-04-05 DIAGNOSIS — I4891 Unspecified atrial fibrillation: Secondary | ICD-10-CM

## 2017-04-05 DIAGNOSIS — Z6836 Body mass index (BMI) 36.0-36.9, adult: Secondary | ICD-10-CM

## 2017-04-05 DIAGNOSIS — R200 Anesthesia of skin: Secondary | ICD-10-CM | POA: Diagnosis present

## 2017-04-05 DIAGNOSIS — Z87891 Personal history of nicotine dependence: Secondary | ICD-10-CM

## 2017-04-05 DIAGNOSIS — E039 Hypothyroidism, unspecified: Secondary | ICD-10-CM | POA: Diagnosis present

## 2017-04-05 DIAGNOSIS — E669 Obesity, unspecified: Secondary | ICD-10-CM | POA: Diagnosis present

## 2017-04-05 DIAGNOSIS — Z79891 Long term (current) use of opiate analgesic: Secondary | ICD-10-CM

## 2017-04-05 DIAGNOSIS — M4802 Spinal stenosis, cervical region: Secondary | ICD-10-CM | POA: Diagnosis present

## 2017-04-05 DIAGNOSIS — Z9049 Acquired absence of other specified parts of digestive tract: Secondary | ICD-10-CM

## 2017-04-05 DIAGNOSIS — R29810 Facial weakness: Secondary | ICD-10-CM | POA: Diagnosis present

## 2017-04-05 DIAGNOSIS — I639 Cerebral infarction, unspecified: Secondary | ICD-10-CM

## 2017-04-05 DIAGNOSIS — Z888 Allergy status to other drugs, medicaments and biological substances status: Secondary | ICD-10-CM

## 2017-04-05 DIAGNOSIS — Z8249 Family history of ischemic heart disease and other diseases of the circulatory system: Secondary | ICD-10-CM

## 2017-04-05 DIAGNOSIS — G629 Polyneuropathy, unspecified: Secondary | ICD-10-CM | POA: Diagnosis present

## 2017-04-05 DIAGNOSIS — M79601 Pain in right arm: Secondary | ICD-10-CM | POA: Insufficient documentation

## 2017-04-05 DIAGNOSIS — I482 Chronic atrial fibrillation: Secondary | ICD-10-CM | POA: Diagnosis present

## 2017-04-05 DIAGNOSIS — M5412 Radiculopathy, cervical region: Secondary | ICD-10-CM | POA: Diagnosis present

## 2017-04-05 DIAGNOSIS — Z90710 Acquired absence of both cervix and uterus: Secondary | ICD-10-CM

## 2017-04-05 DIAGNOSIS — E559 Vitamin D deficiency, unspecified: Secondary | ICD-10-CM | POA: Diagnosis present

## 2017-04-05 DIAGNOSIS — E785 Hyperlipidemia, unspecified: Secondary | ICD-10-CM | POA: Diagnosis present

## 2017-04-05 DIAGNOSIS — M549 Dorsalgia, unspecified: Secondary | ICD-10-CM | POA: Insufficient documentation

## 2017-04-05 DIAGNOSIS — Z886 Allergy status to analgesic agent status: Secondary | ICD-10-CM

## 2017-04-05 DIAGNOSIS — Z91041 Radiographic dye allergy status: Secondary | ICD-10-CM

## 2017-04-05 DIAGNOSIS — R29898 Other symptoms and signs involving the musculoskeletal system: Secondary | ICD-10-CM

## 2017-04-05 DIAGNOSIS — G8929 Other chronic pain: Secondary | ICD-10-CM | POA: Insufficient documentation

## 2017-04-05 DIAGNOSIS — Z885 Allergy status to narcotic agent status: Secondary | ICD-10-CM

## 2017-04-05 DIAGNOSIS — I340 Nonrheumatic mitral (valve) insufficiency: Secondary | ICD-10-CM | POA: Diagnosis present

## 2017-04-05 DIAGNOSIS — I480 Paroxysmal atrial fibrillation: Secondary | ICD-10-CM | POA: Diagnosis present

## 2017-04-05 LAB — CBC
HEMATOCRIT: 38.1 % (ref 36.0–46.0)
HEMOGLOBIN: 12.9 g/dL (ref 12.0–15.0)
MCH: 28.8 pg (ref 26.0–34.0)
MCHC: 33.9 g/dL (ref 30.0–36.0)
MCV: 85 fL (ref 78.0–100.0)
Platelets: 247 10*3/uL (ref 150–400)
RBC: 4.48 MIL/uL (ref 3.87–5.11)
RDW: 12.8 % (ref 11.5–15.5)
WBC: 4.6 10*3/uL (ref 4.0–10.5)

## 2017-04-05 LAB — I-STAT CHEM 8, ED
BUN: 9 mg/dL (ref 6–20)
CALCIUM ION: 1.1 mmol/L — AB (ref 1.15–1.40)
CREATININE: 0.9 mg/dL (ref 0.44–1.00)
Chloride: 106 mmol/L (ref 101–111)
Glucose, Bld: 102 mg/dL — ABNORMAL HIGH (ref 65–99)
HCT: 37 % (ref 36.0–46.0)
Hemoglobin: 12.6 g/dL (ref 12.0–15.0)
Potassium: 3.9 mmol/L (ref 3.5–5.1)
Sodium: 142 mmol/L (ref 135–145)
TCO2: 26 mmol/L (ref 0–100)

## 2017-04-05 LAB — COMPREHENSIVE METABOLIC PANEL
ALBUMIN: 4.3 g/dL (ref 3.5–5.0)
ALK PHOS: 52 U/L (ref 38–126)
ALT: 17 U/L (ref 14–54)
AST: 24 U/L (ref 15–41)
Anion gap: 8 (ref 5–15)
BUN: 7 mg/dL (ref 6–20)
CALCIUM: 9.1 mg/dL (ref 8.9–10.3)
CO2: 22 mmol/L (ref 22–32)
Chloride: 108 mmol/L (ref 101–111)
Creatinine, Ser: 0.91 mg/dL (ref 0.44–1.00)
GFR calc Af Amer: >60 mL/min (ref >60)
GFR calc non Af Amer: >60 mL/min (ref >60)
GLUCOSE: 103 mg/dL — AB (ref 65–99)
Potassium: 3.8 mmol/L (ref 3.5–5.1)
Sodium: 138 mmol/L (ref 135–145)
Total Bilirubin: 0.9 mg/dL (ref 0.3–1.2)
Total Protein: 7.6 g/dL (ref 6.5–8.1)

## 2017-04-05 LAB — DIFFERENTIAL
Basophils Absolute: 0 10*3/uL (ref 0.0–0.1)
Basophils Relative: 0 %
EOS PCT: 3 %
Eosinophils Absolute: 0.1 10*3/uL (ref 0.0–0.7)
LYMPHS ABS: 1.3 10*3/uL (ref 0.7–4.0)
LYMPHS PCT: 28 %
Monocytes Absolute: 0.4 10*3/uL (ref 0.1–1.0)
Monocytes Relative: 8 %
NEUTROS PCT: 61 %
Neutro Abs: 2.8 10*3/uL (ref 1.7–7.7)

## 2017-04-05 LAB — I-STAT TROPONIN, ED: Troponin i, poc: 0.01 ng/mL (ref 0.00–0.08)

## 2017-04-05 LAB — APTT: aPTT: 35 seconds (ref 24–36)

## 2017-04-05 LAB — PROTIME-INR
INR: 1.07
Prothrombin Time: 13.9 seconds (ref 11.4–15.2)

## 2017-04-05 MED ORDER — GABAPENTIN 300 MG PO CAPS: 300.0000 mg | ORAL_CAPSULE | Freq: Three times a day (TID) | ORAL | Status: DC

## 2017-04-05 MED ORDER — ASPIRIN 81 MG PO CHEW
81.0000 mg | CHEWABLE_TABLET | Freq: Every day | ORAL | Status: DC
  Administered 2017-04-06: 81 mg via ORAL
  Filled 2017-04-05: qty 1

## 2017-04-05 MED ORDER — ACETAMINOPHEN 160 MG/5ML PO SOLN: 650.0000 mg | ORAL | Status: DC | PRN

## 2017-04-05 MED ORDER — ASPIRIN 325 MG PO TABS
325.0000 mg | ORAL_TABLET | Freq: Once | ORAL | Status: AC
  Administered 2017-04-05: 325 mg via ORAL
  Filled 2017-04-05: qty 1

## 2017-04-05 MED ORDER — ACETAMINOPHEN 325 MG PO TABS
650.0000 mg | ORAL_TABLET | ORAL | Status: DC | PRN
  Administered 2017-04-05 – 2017-04-07 (×5): 650 mg via ORAL
  Filled 2017-04-05 (×6): qty 2

## 2017-04-05 MED ORDER — STROKE: EARLY STAGES OF RECOVERY BOOK
Freq: Once | Status: AC
  Administered 2017-04-06: 06:00:00

## 2017-04-05 MED ORDER — SENNOSIDES-DOCUSATE SODIUM 8.6-50 MG PO TABS
1.0000 | ORAL_TABLET | Freq: Every evening | ORAL | Status: DC | PRN
  Administered 2017-04-05 – 2017-04-06 (×2): 1 via ORAL
  Filled 2017-04-05 (×2): qty 1

## 2017-04-05 MED ORDER — HEPARIN SODIUM (PORCINE) 5000 UNIT/ML IJ SOLN
5000.0000 [IU] | Freq: Three times a day (TID) | INTRAMUSCULAR | Status: DC
  Administered 2017-04-05 – 2017-04-06 (×3): 5000 [IU] via SUBCUTANEOUS
  Filled 2017-04-05 (×3): qty 1

## 2017-04-05 MED ORDER — ACETAMINOPHEN 650 MG RE SUPP: 650.0000 mg | RECTAL | Status: DC | PRN

## 2017-04-05 MED ORDER — QUETIAPINE FUMARATE 25 MG PO TABS: 25.0000 mg | ORAL_TABLET | Freq: Every evening | ORAL | Status: DC | PRN

## 2017-04-05 NOTE — ED Notes (Addendum)
Admitting Provider at bedside. Diet tray ordered.

## 2017-04-05 NOTE — Consult Note (Signed)
Neurology Consult Note  Reason for Consultation: Possible stroke  Requesting provider: Rolan BuccoMelanie Belfi, MD  CC: R face numbness and drooling  HPI: This is a [age] right-handed woman who presents to the emergency department for evaluation of right facial numbness and drooling. History is obtained directly from the patient was a good historian. Her family is present at the bedside and offer additional information as needed.  The patient reports that she was in her usual state of health until this morning at about 10 AM. She states that she noticed that she was having some numbness in the right side of her face and also felt as if she was drooling from the right side of her mouth. The numbness was also involving the right arm and leg. She did not have any weakness with these symptoms. She denies any speech or vision changes. Symptoms may be a little better though overall she feels that they're largely unchanged compared to their onset. She denies any prior similar symptoms.  She has had some pain and discomfort in the right arm which been present for a couple of weeks. She describes a pins and needle sensation involving the entire right arm from shoulder down to the fingertips. She says this is associated with another feeling where it feels as if something hot is being poured over her arm and says that the sensation moves down her arm from shoulder to hand like a wave. She says the worst of her pain is between the shoulder and the elbow. This is unchanged today. She was seen by her PCP yesterday and was given a prescription for gabapentin 300 mg 3 times daily. She took 300 mg last night and a second 300 mg tablet this morning. This morning, she said that she feels very out of sorts. She says is hard for her to focus. Her daughter says that she does not seem herself today and seems very slow to process things.  Last known well: 10 AM today NHISS score: 1 mRS score: 0 tPA given?: No, minor symptoms, outside  window   PMH:  Past Medical History:  Diagnosis Date  . A-fib (HCC)   . Chronic back pain   . Hyperlipidemia   . Hypertension   . Thyroid disease   . Thyroid disease     PSH:  Past Surgical History:  Procedure Laterality Date  . ABDOMINAL HYSTERECTOMY  2008  . CHOLECYSTECTOMY  1992    Family history: Family History  Problem Relation Age of Onset  . Heart disease Maternal Grandmother   . Stroke Maternal Grandfather     Social history:  Social History   Social History  . Marital status: Divorced    Spouse name: N/A  . Number of children: N/A  . Years of education: N/A   Occupational History  . Not on file.   Social History Main Topics  . Smoking status: Former Smoker    Types: Cigarettes    Quit date: 11/19/1991  . Smokeless tobacco: Never Used  . Alcohol use Yes     Comment: occasional  . Drug use: No  . Sexual activity: Not on file   Other Topics Concern  . Not on file   Social History Narrative  . No narrative on file    Current outpatient meds: Medications reviewed and reconciled Current Meds  Medication Sig  . acetaminophen (TYLENOL) 500 MG tablet Take 500 mg by mouth every 6 (six) hours as needed for mild pain, moderate pain, fever or headache.  .Marland Kitchen  amLODipine (NORVASC) 10 MG tablet TAKE ONE TABLET BY MOUTH ONCE DAILY  . atenolol (TENORMIN) 25 MG tablet TAKE ONE TABLET BY MOUTH DAILY  . diltiazem (CARDIZEM) 120 MG tablet Take 1 tablet (120 mg total) by mouth 2 (two) times daily as needed (for fast heart rate).  . gabapentin (NEURONTIN) 300 MG capsule Take 1 capsule (300 mg total) by mouth 3 (three) times daily.  Marland Kitchen HYDROmorphone (DILAUDID) 2 MG tablet Take 1 tablet (2 mg total) by mouth every 6 (six) hours as needed for severe pain. (Patient taking differently: Take 2 mg by mouth every 6 (six) hours as needed for severe pain (only taking as needed for pain). )    Current inpatient meds: Medications reviewed and reconciled No current  facility-administered medications for this encounter.    Current Outpatient Prescriptions  Medication Sig Dispense Refill  . acetaminophen (TYLENOL) 500 MG tablet Take 500 mg by mouth every 6 (six) hours as needed for mild pain, moderate pain, fever or headache.    Marland Kitchen amLODipine (NORVASC) 10 MG tablet TAKE ONE TABLET BY MOUTH ONCE DAILY 90 tablet 2  . atenolol (TENORMIN) 25 MG tablet TAKE ONE TABLET BY MOUTH DAILY 90 tablet 1  . diltiazem (CARDIZEM) 120 MG tablet Take 1 tablet (120 mg total) by mouth 2 (two) times daily as needed (for fast heart rate). 10 tablet 1  . gabapentin (NEURONTIN) 300 MG capsule Take 1 capsule (300 mg total) by mouth 3 (three) times daily. 90 capsule 1  . HYDROmorphone (DILAUDID) 2 MG tablet Take 1 tablet (2 mg total) by mouth every 6 (six) hours as needed for severe pain. (Patient taking differently: Take 2 mg by mouth every 6 (six) hours as needed for severe pain (only taking as needed for pain). ) 10 tablet 0    Allergies: Allergies  Allergen Reactions  . Iodine Hives  . Codeine Nausea And Vomiting  . Hctz [Hydrochlorothiazide] Other (See Comments)    Severe cramping   . Hydrocodone Other (See Comments)    Severe back and heart rate   . Ivp Dye [Iodinated Diagnostic Agents] Hives  . Prednisone Other (See Comments)    Shaky, "wasn't feeling herself"  . Simvastatin Other (See Comments)    Severe cramping   . Stadol [Butorphanol]   . Vitamin D Analogs Other (See Comments)    Severe pain from head to toe   . Xarelto [Rivaroxaban] Other (See Comments)    Severe bleeding   . Nsaids Hives    ROS: As per HPI. A full 14-point review of systems was performed and is otherwise unremarkable.   PE:  BP (!) 144/74   Pulse (!) 51   Temp 98.1 F (36.7 C)   Resp 15   Ht 5\' 8"  (1.727 m)   Wt 109.8 kg (242 lb)   SpO2 100%   BMI 36.80 kg/m   General: WDWN resting on ED gurney. AAO x4. Speech clear, no dysarthria. No aphasia. Follows commands briskly. Affect is  anxious and at times tearful. Comportment is normal.  HEENT: Normocephalic. Neck supple without LAD. MMM, OP clear. Dentition good. Sclerae anicteric. No conjunctival injection.  CV: Regular, no murmur. Carotid pulses full and symmetric, no bruits. Distal pulses 2+ and symmetric.  Lungs: CTAB.  Abdomen: Soft, obese, non-distended, non-tender. Bowel sounds present x4.  Extremities: No C/C/E. Neuro:  CN: Pupils are equal and round. They are symmetrically reactive from 3-->2 mm. visual fields are full to confrontation bilaterally. EOMI without nystagmus. No reported  diplopia. Facial sensation is intact to light touch but she has mildly decreased pinprick over the right side of the face. Face is symmetric at rest with normal strength and mobility. Hearing is intact to conversational voice. Palate elevates symmetrically and uvula is midline. Voice is normal in tone, pitch and quality. Bilateral SCM and trapezii are 5/5. Tongue is midline with normal bulk and mobility.  Motor: Normal bulk, tone, and strength. No tremor or other abnormal movements. No drift.  Sensation: Intact to light touch. Pinprick is mildly diminished in the right arm and leg. DTRs: 2+, symmetric. Toes downgoing bilaterally. No pathologic reflexes.  Coordination: Finger-to-nose is without dysmetria. Finger taps are normal in amplitude and speed, no decrement.    Labs:  Lab Results  Component Value Date   WBC 4.6 04/05/2017   HGB 12.6 04/05/2017   HCT 37.0 04/05/2017   PLT 247 04/05/2017   GLUCOSE 102 (H) 04/05/2017   ALT 17 04/05/2017   AST 24 04/05/2017   NA 142 04/05/2017   K 3.9 04/05/2017   CL 106 04/05/2017   CREATININE 0.90 04/05/2017   BUN 9 04/05/2017   CO2 22 04/05/2017   TSH 3.51 02/05/2016   INR 1.07 04/05/2017   HGBA1C 5.3 09/27/2016   Troponin 0.01 PTT 35  Imaging:  I have personally and independently reviewed CT scan of the head without contrast from today. This is unremarkable.  Assessment and  Plan:  1. Acute Ischemic Stroke: This is an acute stroke, it is most likely a small vessel thrombotic event involving the subcortical structures, possibly the internal capsule, basal ganglia, or thalamus. Known risk factors for cerebrovascular disease in this patient include A. fib (currently sinus rhythm), hyperlipidemia, hypertension, and obesity. Additional workup has been ordered, including MRI brain, MRA of the head, carotid Dopplers, TTE, fasting lipids, and hemoglobin a1c. Further testing will be determined by results from these initial studies. Recommend antiplatelet therapy with aspirin for secondary stroke prevention once cleared to take oral medications. Consider adding statin with goal LDL less than 70. Ensure adequate glucose control. Allow permissive hypertension in the acute phase, treating only SBP greater than 220 mmHg and/or DBP greater than 110 mmHg. Avoid fever and hyperglycemia as these can extend the infarct and are associated with worse neurological outcomes. Initiate rehab services as needed. DVT prophylaxis as needed.   2. Right-sided numbness: She has very mild sensory loss over the right side on my examination. This is acute, concerning for acute stroke as noted above. Follow exam.  3. Right arm pain. This is subacute, present for the past couple of weeks. Her description is pain that extends from the shoulder down into all of her fingers involving the entirety of her arm. This would be unlikely to be radicular in etiology given the distribution. MRI brain and MRI of the cervical spine has been ordered for further investigation.  4. Acute encephalopathy: She appears to have a very mild encephalopathy, which is new according to family at bedside. She says that she has difficulty forming her thoughts and her family notices that she seems to be slower than usual. I suspect this is a response to the gabapentin which was just started yesterday. She was started on 300 mg 3 times daily and  this may be too high a dose for her. This can be held for now. If pain continues at the time of discharge, this could be restarted but I would recommend a more gradual titration. I would start at 300 mg  at bedtime and increase by 300 mg each week to a target dose of 300 mg 3 times daily. Minimize CNS active medications, particularly benzodiazepines, opiates, and anything with strong anticholinergic properties. MRI scan brain pending.  This was discussed with the patient and her family, including 2 daughters and a son-in-law. Education was provided on the diagnosis and expected evaluation and treatment. They are in agreement with the plan as noted. They were given the opportunity to ask any questions and these were addressed to their satisfaction.   Thank you for this consultation. The stroke team will assume care of the patient being 04/06/17. Please call with any questions or concerns.

## 2017-04-05 NOTE — ED Triage Notes (Signed)
Pt here with "facial numbness with some drooling" this am, was seen at dr's office for right arm numbness yesterday and was told to come to ED if there were any changes. Pt states "I just don't feel well" - has hx of AFib.

## 2017-04-05 NOTE — H&P (Signed)
Family [facility] History and Physical Service Pager: [phone]  Patient name: Kristi Duncan Medical record number: [id number] Date of birth: [birth year] Age: 56 y.o. Gender: female  Primary Care Provider: Mayo, Allyn KennerKaty Dodd, MD Consultants: Neurology  Code Status: Full   Chief Complaint: Right sided facial droop and numbness  Assessment and Plan: Kristi EasternMazie F Reichenberger is a 56 y.o. female with a past medical history significant for HTN, HLD, atrial fibrillation, hypothyroidism and Vitamin D deficiency  presenting with right facial droop and numbness concerning for stroke.  #Right facial droop, numbness and drooling, acute Patient reports right facial droop and numbness and some drooling accompanied with mild cognitive changes with prompt resolution. Symptoms are concerning given patient atrial fibrillation not on anticoagulation and hyperlipidemia and not on statin therapy. Headaches and elevated BP were also noted for the past week. Given quick resolution of symptoms and patient in normal sinus rhythm (NSR) on admission, presentation is more consistent with TIA than ischemic stroke though likely. Patient also have been having cervical pain with radiculopathy down her right arm. Head CT was within normal limit. Symptoms could be consistent with nerve impingement involve facial nerve and triggering symptoms. Neurologic workup and additional imaging needed to further assess probable etiology. --Admit to FPTS,  admitting physician Dr. Pollie MeyerMcIntyre --Admit to Telemetry --Follow up on neuro consult appreciate recs --Order MRI and MRA of the brain --Order carotid Doppler bilaterally --Follow up on ECHO --Follow up on TSH, lipid panel, A1c, HIV, am CBC and BMP --Tylenol 650 mg q4 prn  --Monitor vitals --Neuro checks q2 --patient passed Swallow assessment --ASA 325 mg x1, then 81 mg daily   #Neck pain with radiation to arm, subacute, worsening Pain appears to be from  cervical radiculopathy, patient will need further imaging to confirm diagnostic. Patient was recently (5/18) started on gabapentin for neuropathy but has not given it time to assess efficaciousness in controlling some of the pain. --Order MR cervical spine w/o contrast --will hold gabapentin per neurology recommendations due to concern for encephalopathy, would likely resume at starting dose of 300 mg qhs   #Atrial Fibrillation, chronic, uncontrolled Pateint reports history of atrial fibrillation and palpitations was on Xarelto in the past but did not tolerate due to bleeding gums and bloody stool. Patient also report few episodes of heart palpitations on diltiazem for rate control. Patient is at high risk for thromboembolism episodes given  risk factors and lack of therapy. On admission, patient is in NSR and rate controlled. --Hold diltiazem 120 bid during neuro work up --Patient should consider other DOAC for anticoagulation given risks  #HTN, chronic Patient with elevated BP on admission 155/69 with a max of 164/80 in the ED. Patient's norvasc dose was increased from 5 mg to 10 mg in January. Patient brought he BP meter with reading since January. Patient has been averaging 130-140 systolic and 70-80 diastolic with intermittent elevated BP into the 160's over 80's. Given presentating symptoms and suspicion for possible TIA/Stroke, will not restart BP meds to allow for permissive HTN. On admission to the floor BP was 125/64. --Hold amlodipine 10 mg daily  --Hold atenolol 25 mg daily  #Dyspnea on exertion/Lower extremity edema Patient does not have a history of CHF, but was told that she needed a formal study for evaluation for OSA. Patient report PND and orthopnea that could be consistent with CHF. Patient also endorses mild DOE. Has had new LE edema over the past few weeks.  --Will follow up on  ECHO --Possible cardiology consult  --LE edema may be worsened by increase in amlodipine dose, may  decrease if BP tolerates    #HLD, chronic, untreated Patient has a history of hyperlipidemia and was started on statin ( simvastatin and rosuvastatin) therapy in the past but reported severe myalgias and had to stop. --lipid panel pending  --could consider trial of another statin if patient agreeable   #Hypothyroidism Patient mentioned hypothyroidism and was on levothyroxine, but stoped taking the medication about a year when she was told she did not medication.  Last TSH on file was 3.51 on 02/05/2016 which is within normal limit --Will follow up on repeat TSH --Patient need to follow up with PCP  #Vitamin D deficiency, diet controlled  Patient unable to tolerate supplement, has been controlled through diet.  FEN/GI: Regular Diet  Prophylaxis: Heparin SQ   Disposition: Stroke work up  History of Present Illness:  Kristi Duncan is a 56 y.o. female with a past medical history significant for HTN, HLD, atrial fibrillation, hypothyroidism and Vitamin D deficiency presenting with recent episode of right sided facial droop, numbness and drooling. Patient reports that symptoms started 2 weeks ago with right sided neck, shoulder pain, tingling, numbness and mild weakness radiating down her right arm. Patient also reports heavy sensation in right arm for the past couple of weeks and feels like her grip has become weaker. She also reports global headache for over one week for which she has been taking tylenol and ASA at home. Daughter at bedside reports patient has not been herself today. She has been more lethargic but has been coherent this whole time, answering all questions appropriately but speech is slower. Patient endorses temporary fogginess in her thought process early this morning but is back to baseline.Patient denies any lower extremities weakness, chest pain, palpitations, endorses some intermittent shortness of breath that appears to be chronic.    Patient was seen in the [facility] clinic  yesterday (5/18) for cervical and right arm pain, evaluation was consistent with cervical radiculopathy with Cervical MRI need for definite diagnosis.   Review Of Systems: Per HPI with the following additions: Chronic back pain.   ROS  Patient Active Problem List   Diagnosis Date Noted  . Facial numbness 04/05/2017  . Numbness of arm 04/04/2017  . Hyperglycemia 09/27/2016  . Low back pain 04/08/2016  . Sickle cell trait (HCC) 02/05/2016  . Essential hypertension 02/05/2016  . Hyperlipidemia 02/05/2016  . Hypothyroidism 02/05/2016  . Vitamin D deficiency 02/05/2016  . Health care maintenance 02/05/2016  . Atrial fibrillation (HCC) 02/05/2016    Past Medical History: Past Medical History:  Diagnosis Date  . A-fib (HCC)   . Chronic back pain   . Hyperlipidemia   . Hypertension   . Thyroid disease   . Thyroid disease     Past Surgical History: Past Surgical History:  Procedure Laterality Date  . ABDOMINAL HYSTERECTOMY  2008  . CHOLECYSTECTOMY  1992    Social History: Social History  Substance Use Topics  . Smoking status: Former Smoker    Types: Cigarettes    Quit date: 11/19/1991  . Smokeless tobacco: Never Used  . Alcohol use Yes     Comment: occasional   Additional social history:  Please also refer to relevant sections of EMR.  Family History: Family History  Problem Relation Age of Onset  . Heart disease Maternal Grandmother   . Stroke Maternal Grandfather     Allergies and Medications: Allergies  Allergen Reactions  .  Iodine Hives  . Codeine Nausea And Vomiting  . Hctz [Hydrochlorothiazide] Other (See Comments)    Severe cramping   . Hydrocodone Other (See Comments)    Severe back and heart rate   . Ivp Dye [Iodinated Diagnostic Agents] Hives  . Prednisone Other (See Comments)    Shaky, "wasn't feeling herself"  . Simvastatin Other (See Comments)    Severe cramping   . Stadol [Butorphanol]   . Vitamin D Analogs Other (See Comments)    Severe  pain from head to toe   . Xarelto [Rivaroxaban] Other (See Comments)    Severe bleeding   . Nsaids Hives   No current facility-administered medications on file prior to encounter.    Current Outpatient Prescriptions on File Prior to Encounter  Medication Sig Dispense Refill  . acetaminophen (TYLENOL) 500 MG tablet Take 500 mg by mouth every 6 (six) hours as needed for mild pain, moderate pain, fever or headache.    Marland Kitchen amLODipine (NORVASC) 10 MG tablet TAKE ONE TABLET BY MOUTH ONCE DAILY 90 tablet 2  . atenolol (TENORMIN) 25 MG tablet TAKE ONE TABLET BY MOUTH DAILY 90 tablet 1  . diltiazem (CARDIZEM) 120 MG tablet Take 1 tablet (120 mg total) by mouth 2 (two) times daily as needed (for fast heart rate). 10 tablet 1  . gabapentin (NEURONTIN) 300 MG capsule Take 1 capsule (300 mg total) by mouth 3 (three) times daily. 90 capsule 1  . HYDROmorphone (DILAUDID) 2 MG tablet Take 1 tablet (2 mg total) by mouth every 6 (six) hours as needed for severe pain. (Patient taking differently: Take 2 mg by mouth every 6 (six) hours as needed for severe pain (only taking as needed for pain). ) 10 tablet 0    Objective: BP (!) 161/70   Pulse (!) 52   Temp 98.1 F (36.7 C)   Resp (!) 22   Ht 5\' 8"  (1.727 m)   Wt 242 lb (109.8 kg)   SpO2 99%   BMI 36.80 kg/m   Physical Exam: General: Pleasant woman, able to participate in exam, appropiate speech and coherent thought process Cardiac: RRR, normal heart sounds, no murmurs. 2+ radial and PT pulses bilaterally Respiratory: CTAB, normal effort, No wheezes, rales or rhonchi Abdomen: soft, nontender, nondistended, no hepatic or splenomegaly, +BS Extremities: no edema or cyanosis. WWP. Skin: warm and dry, no rashes noted Neuro: alert and oriented x4, no focal deficit appreciated on exam, 5/5 strength upper and lower extremities, sensation intact, CNII-XII intact, no dysmetria and normal rapid alternating movement. Gait normal, pronator drift not performed due  to pain.  Psych: Normal affect and mood  Labs and Imaging: CBC BMET   Recent Labs Lab 04/05/17 1239 04/05/17 1303  WBC 4.6  --   HGB 12.9 12.6  HCT 38.1 37.0  PLT 247  --     Recent Labs Lab 04/05/17 1239 04/05/17 1303  NA 138 142  K 3.8 3.9  CL 108 106  CO2 22  --   BUN 7 9  CREATININE 0.91 0.90  GLUCOSE 103* 102*  CALCIUM 9.1  --        Lovena Neighbours, MD  04/05/2017, 4:19 PM PGY-1, [facility] FPTS Intern pager: [phone], text pages welcome  Upper Level Addendum:  I have seen and evaluated this patient along with Dr. Sydnee Cabal and reviewed the above note, making necessary revisions in green.   Marcy Siren, D.O. 04/05/2017, 8:13 PM PGY-2, [facility]

## 2017-04-05 NOTE — Progress Notes (Signed)
Patient admitted to 5M19. Patient is alert, oriented x4, NIHHS 0, patient walked to bed with stand by assist from staff. Skin intact. Tele on. Patient and family oriented to room, unit, staff, plan of care. Will continue to monitor.

## 2017-04-05 NOTE — ED Provider Notes (Signed)
[facility]-EMERGENCY DEPT Provider Note   CSN: [id number] Arrival date & time: 04/05/17  1208     History   Chief Complaint Chief Complaint  Patient presents with  . stroke sx  . facial numbness    HPI Kristi Duncan is a 56 y.o. female.  Patient is a 61 female with a history of atrial fibrillation, hypertension and hyperlipidemia who presents with facial drooping. She is not on anticoagulants because she states that she hasn't tolerated them in the past due to bleeding. She states over last 2 weeks she's had some numbness and weakness to her right arm. She states is in the whole hand. She has been dropping things. She also reports some pain that radiates from her neck down to her upper arm on the right side. Today she noted about 10:00 that she started having some numbness to the right side of her face and drooling out of the side of her face. She denies any speech deficits. No vision changes. No involvement of the legs. Her right arm numbness and weakness is unchanged. She was seen by her PCP yesterday who felt that her arm symptoms are cervical radiculopathy.      Past Medical History:  Diagnosis Date  . A-fib (HCC)   . Chronic back pain   . Hyperlipidemia   . Hypertension   . Thyroid disease   . Thyroid disease     Patient Active Problem List   Diagnosis Date Noted  . Numbness of arm 04/04/2017  . Hyperglycemia 09/27/2016  . Low back pain 04/08/2016  . Sickle cell trait (HCC) 02/05/2016  . Essential hypertension 02/05/2016  . Hyperlipidemia 02/05/2016  . Hypothyroidism 02/05/2016  . Vitamin D deficiency 02/05/2016  . Health care maintenance 02/05/2016  . Atrial fibrillation (HCC) 02/05/2016    Past Surgical History:  Procedure Laterality Date  . ABDOMINAL HYSTERECTOMY  2008  . CHOLECYSTECTOMY  1992    OB History    No data available       Home Medications    Prior to Admission medications   Medication Sig Start Date End Date Taking? Authorizing Provider   acetaminophen (TYLENOL) 500 MG tablet Take 500 mg by mouth every 6 (six) hours as needed for mild pain, moderate pain, fever or headache.    [provider]  amLODipine (NORVASC) 10 MG tablet TAKE ONE TABLET BY MOUTH ONCE DAILY 01/13/17   Mayo, Allyn Kenner, MD  atenolol (TENORMIN) 25 MG tablet TAKE ONE TABLET BY MOUTH DAILY 02/10/17   Mayo, Allyn Kenner, MD  diltiazem (CARDIZEM) 120 MG tablet Take 1 tablet (120 mg total) by mouth 2 (two) times daily as needed (for fast heart rate). 02/22/16   Camnitz, Andree Coss, MD  gabapentin (NEURONTIN) 300 MG capsule Take 1 capsule (300 mg total) by mouth 3 (three) times daily. 04/04/17   Uvaldo Rising, MD  HYDROmorphone (DILAUDID) 2 MG tablet Take 1 tablet (2 mg total) by mouth every 6 (six) hours as needed for severe pain. Patient taking differently: Take 2 mg by mouth every 6 (six) hours as needed for severe pain (only taking as needed for pain).  02/20/16   Benjiman Core, MD    Family History Family History  Problem Relation Age of Onset  . Heart disease Maternal Grandmother   . Stroke Maternal Grandfather     Social History Social History  Substance Use Topics  . Smoking status: Former Smoker    Types: Cigarettes    Quit date: 11/19/1991  .  Smokeless tobacco: Never Used  . Alcohol use Yes     Comment: occasional     Allergies   Iodine; Codeine; Hctz [hydrochlorothiazide]; Hydrocodone; Ivp dye [iodinated diagnostic agents]; Prednisone; Simvastatin; Stadol [butorphanol]; Vitamin d analogs; Xarelto [rivaroxaban]; and Nsaids   Review of Systems Review of Systems  Constitutional: Negative for chills, diaphoresis, fatigue and fever.  HENT: Negative for congestion, rhinorrhea and sneezing.   Eyes: Positive for redness.  Respiratory: Negative for cough, chest tightness and shortness of breath.   Cardiovascular: Negative for chest pain and leg swelling.  Gastrointestinal: Negative for abdominal pain, blood in stool, diarrhea, nausea and  vomiting.  Genitourinary: Negative for difficulty urinating, flank pain, frequency and hematuria.  Musculoskeletal: Negative for arthralgias and back pain.  Skin: Negative for rash.  Neurological: Positive for weakness, numbness and headaches. Negative for dizziness and speech difficulty.     Physical Exam Updated Vital Signs BP (!) 143/65   Pulse 62   Temp 98.1 F (36.7 C)   Resp 16   Ht 5\' 8"  (1.727 m)   Wt 242 lb (109.8 kg)   SpO2 99%   BMI 36.80 kg/m   Physical Exam  Constitutional: She is oriented to person, place, and time. She appears well-developed and well-nourished.  HENT:  Head: Normocephalic and atraumatic.  Eyes: Pupils are equal, round, and reactive to light.  Neck: Normal range of motion. Neck supple.  Cardiovascular: Normal rate, regular rhythm and normal heart sounds.   Pulmonary/Chest: Effort normal and breath sounds normal. No respiratory distress. She has no wheezes. She has no rales. She exhibits no tenderness.  Abdominal: Soft. Bowel sounds are normal. There is no tenderness. There is no rebound and no guarding.  Musculoskeletal: Normal range of motion. She exhibits no edema.  Lymphadenopathy:    She has no cervical adenopathy.  Neurological: She is alert and oriented to person, place, and time.  Patient has some slight drooping of the corner of the right mouth. She has some diminished sensation to light touch to the right lower face. There is no other cranial nerve deficits identified. She has some diminished sensation to light touch in her right hand as compared to her left. She has normal grip strength. She is normal motor function and sensation to the lower extremity bilaterally.  Skin: Skin is warm and dry. No rash noted.  Psychiatric: She has a normal mood and affect.     ED Treatments / Results  Labs (all labs ordered are listed, but only abnormal results are displayed) Labs Reviewed  COMPREHENSIVE METABOLIC PANEL - Abnormal; Notable for the  following:       Result Value   Glucose, Bld 103 (*)    All other components within normal limits  I-STAT CHEM 8, ED - Abnormal; Notable for the following:    Glucose, Bld 102 (*)    Calcium, Ion 1.10 (*)    All other components within normal limits  PROTIME-INR  APTT  CBC  DIFFERENTIAL  I-STAT TROPOININ, ED  CBG MONITORING, ED    EKG  EKG Interpretation  Date/Time:  Saturday Apr 05 2017 12:14:39 EDT Ventricular Rate:  63 PR Interval:  134 QRS Duration: 84 QT Interval:  394 QTC Calculation: 403 R Axis:   39 Text Interpretation:  Normal sinus rhythm Cannot rule out Anterior infarct , age undetermined Abnormal ECG Confirmed by Syniyah Bourne  MD, Renessa Wellnitz ([id number]) on 04/05/2017 2:33:05 PM       Radiology Ct Head Wo Contrast  Result Date: 04/05/2017  CLINICAL DATA:  Patient has had pain down left arm for 2 weeks with out injury EXAM: CT HEAD WITHOUT CONTRAST TECHNIQUE: Contiguous axial images were obtained from the base of the skull through the vertex without intravenous contrast. COMPARISON:  None. FINDINGS: Brain: No evidence of acute infarction, hemorrhage, hydrocephalus, extra-axial collection or mass lesion/mass effect. Vascular: No hyperdense vessel or unexpected calcification. Skull: Normal. Negative for fracture or focal lesion. Sinuses/Orbits: Normal globes and orbits. The visualized sinuses and mastoid air cells are clear. Other: None. IMPRESSION: Normal unenhanced CT scan of the brain. Electronically Signed   By: Amie Portland M.D.   On: 04/05/2017 13:13    Procedures Procedures (including critical care time)  Medications Ordered in ED Medications - No data to display   Initial Impression / Assessment and Plan / ED Course  I have reviewed the triage vital signs and the nursing notes.  Pertinent labs & imaging results that were available during my care of the patient were reviewed by me and considered in my medical decision making (see chart for details).     Patient  presents with right-sided arm weakness and numbness associated in new-onset right-sided facial drooping and drooling that started today. The right arm numbness and weakness does sound suspicious for cervical radiculopathy as it is associated with pain. However she now has associated drooling with facial numbness that cannot be explained by that. Her head CT is negative. I spoke with Dr. Roxy Manns who will see the patient for neurology services. I spoke with family medicine who will admit the patient for further stroke evaluation.  Final Clinical Impressions(s) / ED Diagnoses   Final diagnoses:  Stroke determined by clinical assessment [facility])    New Prescriptions New Prescriptions   No medications on file     Rolan Bucco, MD 04/05/17 [phone]

## 2017-04-06 ENCOUNTER — Inpatient Hospital Stay (HOSPITAL_COMMUNITY): Payer: Self-pay

## 2017-04-06 DIAGNOSIS — M5412 Radiculopathy, cervical region: Secondary | ICD-10-CM

## 2017-04-06 DIAGNOSIS — G43109 Migraine with aura, not intractable, without status migrainosus: Secondary | ICD-10-CM

## 2017-04-06 DIAGNOSIS — G451 Carotid artery syndrome (hemispheric): Secondary | ICD-10-CM

## 2017-04-06 DIAGNOSIS — I340 Nonrheumatic mitral (valve) insufficiency: Secondary | ICD-10-CM

## 2017-04-06 DIAGNOSIS — E785 Hyperlipidemia, unspecified: Secondary | ICD-10-CM

## 2017-04-06 DIAGNOSIS — R200 Anesthesia of skin: Secondary | ICD-10-CM

## 2017-04-06 DIAGNOSIS — I480 Paroxysmal atrial fibrillation: Secondary | ICD-10-CM

## 2017-04-06 LAB — RAPID URINE DRUG SCREEN, HOSP PERFORMED
Amphetamines: NOT DETECTED
BENZODIAZEPINES: NOT DETECTED
Barbiturates: NOT DETECTED
COCAINE: NOT DETECTED
OPIATES: NOT DETECTED
Tetrahydrocannabinol: NOT DETECTED

## 2017-04-06 LAB — T4, FREE: Free T4: 0.86 ng/dL (ref 0.61–1.12)

## 2017-04-06 LAB — BASIC METABOLIC PANEL
ANION GAP: 9 (ref 5–15)
BUN: 10 mg/dL (ref 6–20)
CALCIUM: 9.2 mg/dL (ref 8.9–10.3)
CO2: 26 mmol/L (ref 22–32)
CREATININE: 0.96 mg/dL (ref 0.44–1.00)
Chloride: 105 mmol/L (ref 101–111)
Glucose, Bld: 97 mg/dL (ref 65–99)
Potassium: 3.6 mmol/L (ref 3.5–5.1)
SODIUM: 140 mmol/L (ref 135–145)

## 2017-04-06 LAB — LIPID PANEL
CHOL/HDL RATIO: 4 ratio
Cholesterol: 191 mg/dL (ref 0–200)
HDL: 48 mg/dL (ref >40)
LDL Cholesterol: 125 mg/dL — ABNORMAL HIGH (ref 0–99)
TRIGLYCERIDES: 92 mg/dL (ref <150)
VLDL: 18 mg/dL (ref 0–40)

## 2017-04-06 LAB — CBC
HCT: 39 % (ref 36.0–46.0)
HEMOGLOBIN: 13 g/dL (ref 12.0–15.0)
MCH: 28.3 pg (ref 26.0–34.0)
MCHC: 33.3 g/dL (ref 30.0–36.0)
MCV: 84.8 fL (ref 78.0–100.0)
PLATELETS: 257 10*3/uL (ref 150–400)
RBC: 4.6 MIL/uL (ref 3.87–5.11)
RDW: 12.7 % (ref 11.5–15.5)
WBC: 4.6 10*3/uL (ref 4.0–10.5)

## 2017-04-06 LAB — ECHOCARDIOGRAM COMPLETE
Height: 68 in
Weight: 3872 oz

## 2017-04-06 LAB — TSH: TSH: 4.867 u[IU]/mL — ABNORMAL HIGH (ref 0.350–4.500)

## 2017-04-06 LAB — HIV ANTIBODY (ROUTINE TESTING W REFLEX): HIV SCREEN 4TH GENERATION: NONREACTIVE

## 2017-04-06 MED ORDER — GABAPENTIN 300 MG PO CAPS: 300.0000 mg | ORAL_CAPSULE | Freq: Every day | ORAL | Status: DC

## 2017-04-06 MED ORDER — LEVOTHYROXINE SODIUM 50 MCG PO TABS
50.0000 ug | ORAL_TABLET | Freq: Every day | ORAL | Status: DC
  Administered 2017-04-06: 50 ug via ORAL
  Filled 2017-04-06: qty 1

## 2017-04-06 MED ORDER — PRAVASTATIN SODIUM 20 MG PO TABS: 20.0000 mg | ORAL_TABLET | Freq: Every day | ORAL | Status: DC

## 2017-04-06 MED ORDER — APIXABAN 5 MG PO TABS
5.0000 mg | ORAL_TABLET | Freq: Two times a day (BID) | ORAL | Status: DC
  Administered 2017-04-06 – 2017-04-07 (×2): 5 mg via ORAL
  Filled 2017-04-06 (×2): qty 1

## 2017-04-06 MED ORDER — GABAPENTIN 100 MG PO CAPS
100.0000 mg | ORAL_CAPSULE | Freq: Every day | ORAL | Status: DC
  Administered 2017-04-06: 100 mg via ORAL
  Filled 2017-04-06 (×2): qty 1

## 2017-04-06 NOTE — Evaluation (Signed)
Physical Therapy Evaluation Patient Details Name: Kristi Duncan MRN: [id number] DOB: 11/07/1961 Today's Date: 04/06/2017   History of Present Illness  56 y.o. female with a past medical history significant for HTN, HLD, atrial fibrillation, hypothyroidism and Vitamin D deficiency  presenting with right facial droop and numbness concerning for stroke., neuro workup underway.  Clinical Impression  Orders received for PT evaluation. Patient demonstrates modest deficits in functional mobility but overall functional with no significant PT needs. Anticipate patient will be safe for d/c home with family when medically appropriate.   OF NOTE: patient did endorse RUE paresthesias with increased activity. HR stable mid 70s throughout session.      Follow Up Recommendations No PT follow up    Equipment Recommendations  None recommended by PT    Recommendations for Other Services       Precautions / Restrictions Restrictions Weight Bearing Restrictions: No      Mobility  Bed Mobility Overal bed mobility: Modified Independent             General bed mobility comments: increased time  Transfers Overall transfer level: Modified independent               General transfer comment: increased time  Ambulation/Gait Ambulation/Gait assistance: Independent Ambulation Distance (Feet): 210 Feet Assistive device: None          Stairs Stairs: Yes Stairs assistance: Modified independent (Device/Increase time) Stair Management: One rail Left Number of Stairs: 4 General stair comments: no difficulty  Wheelchair Mobility    Modified Rankin (Stroke Patients Only) Modified Rankin (Stroke Patients Only) Pre-Morbid Rankin Score: No significant disability Modified Rankin: No significant disability     Balance                             High level balance activites: Side stepping;Backward walking;Direction changes;Turns;Sudden stops;Head turns High Level  Balance Comments: no cues or physical assist required             Pertinent Vitals/Pain Pain Assessment: 0-10 Pain Score: 4  Pain Location: headache Pain Descriptors / Indicators: Headache Pain Intervention(s): Monitored during session    Home Living Family/patient expects to be discharged to:: Private residence Living Arrangements: Alone Available Help at Discharge: Family Type of Home: House Home Access: Level entry     Home Layout: Two level Home Equipment: Cane - single point Additional Comments: does not use the cane    Prior Function Level of Independence: Independent               Hand Dominance   Dominant Hand: Right    Extremity/Trunk Assessment   Upper Extremity Assessment Upper Extremity Assessment: Defer to OT evaluation    Lower Extremity Assessment Lower Extremity Assessment: Overall WFL for tasks assessed       Communication      Cognition Arousal/Alertness: Awake/alert Behavior During Therapy: WFL for tasks assessed/performed Overall Cognitive Status: Within Functional Limits for tasks assessed                                        General Comments      Exercises     Assessment/Plan    PT Assessment Patent does not need any further PT services  PT Problem List         PT Treatment Interventions      PT  Goals (Current goals can be found in the Care Plan section)  Acute Rehab PT Goals PT Goal Formulation: All assessment and education complete, DC therapy    Frequency     Barriers to discharge        Co-evaluation               AM-PAC PT "6 Clicks" Daily Activity  Outcome Measure Difficulty turning over in bed (including adjusting bedclothes, sheets and blankets)?: A Little Difficulty moving from lying on back to sitting on the side of the bed? : A Little Difficulty sitting down on and standing up from a chair with arms (e.g., wheelchair, bedside commode, etc,.)?: A Little Help needed moving  to and from a bed to chair (including a wheelchair)?: None Help needed walking in hospital room?: None Help needed climbing 3-5 steps with a railing? : None 6 Click Score: 21    End of Session Equipment Utilized During Treatment: Gait belt Activity Tolerance: Patient tolerated treatment well Patient left: in bed;with call bell/phone within reach;with family/visitor present Nurse Communication: Mobility status PT Visit Diagnosis: Unsteadiness on feet (R26.81)    Time: 1610-9604 PT Time Calculation (min) (ACUTE ONLY): 16 min   Charges:   PT Evaluation $PT Eval Low Complexity: 1 Procedure     PT G Codes:        Charlotte Crumb, PT DPT  [phone]   Fabio Asa 04/06/2017, 11:25 AM

## 2017-04-06 NOTE — Progress Notes (Signed)
  Echocardiogram 2D Echocardiogram has been performed.  Dawnette Mione T Abygayle Deltoro 04/06/2017, 1:04 PM

## 2017-04-06 NOTE — Progress Notes (Signed)
ANTICOAGULATION CONSULT NOTE - Initial Consult  Pharmacy Consult for apixiban Indication: atrial fibrillation  Allergies  Allergen Reactions  . Iodine Hives  . Codeine Nausea And Vomiting  . Hctz [Hydrochlorothiazide] Other (See Comments)    Severe cramping   . Hydrocodone Other (See Comments)    Severe back and heart rate   . Ivp Dye [Iodinated Diagnostic Agents] Hives  . Prednisone Other (See Comments)    Shaky, "wasn't feeling herself"  . Simvastatin Other (See Comments)    Severe cramping   . Stadol [Butorphanol]   . Vitamin D Analogs Other (See Comments)    Severe pain from head to toe   . Xarelto [Rivaroxaban] Other (See Comments)    Severe bleeding   . Nsaids Hives    Patient Measurements: Height: 5\' 8"  (172.7 cm) Weight: 242 lb (109.8 kg) IBW/kg (Calculated) : 63.9  Vital Signs: Temp: 98 F (36.7 C) (05/20 0905) Temp Source: Oral (05/20 0905) BP: 135/61 (05/20 0905) Pulse Rate: 55 (05/20 0905)  Labs:  Recent Labs  04/05/17 1239 04/05/17 1303 04/06/17 0603  HGB 12.9 12.6 13.0  HCT 38.1 37.0 39.0  PLT 247  --  257  APTT 35  --   --   LABPROT 13.9  --   --   INR 1.07  --   --   CREATININE 0.91 0.90 0.96    Estimated Creatinine Clearance: 84 mL/min (by C-G formula based on SCr of 0.96 mg/dL).   Medical History: Past Medical History:  Diagnosis Date  . A-fib (HCC)   . Chronic back pain   . Hyperlipidemia   . Hypertension   . Thyroid disease   . Thyroid disease     Medications:  Prescriptions Prior to Admission  Medication Sig Dispense Refill Last Dose  . acetaminophen (TYLENOL) 500 MG tablet Take 500 mg by mouth every 6 (six) hours as needed for mild pain, moderate pain, fever or headache.   Past Week at Unknown time  . amLODipine (NORVASC) 10 MG tablet TAKE ONE TABLET BY MOUTH ONCE DAILY 90 tablet 2 04/05/2017 at Unknown time  . atenolol (TENORMIN) 25 MG tablet TAKE ONE TABLET BY MOUTH DAILY 90 tablet 1 04/04/2017 at 9p  . diltiazem  (CARDIZEM) 120 MG tablet Take 1 tablet (120 mg total) by mouth 2 (two) times daily as needed (for fast heart rate). 10 tablet 1 Past Month at Unknown time  . gabapentin (NEURONTIN) 300 MG capsule Take 1 capsule (300 mg total) by mouth 3 (three) times daily. 90 capsule 1 04/05/2017 at Unknown time  . HYDROmorphone (DILAUDID) 2 MG tablet Take 1 tablet (2 mg total) by mouth every 6 (six) hours as needed for severe pain. (Patient taking differently: Take 2 mg by mouth every 6 (six) hours as needed for severe pain (only taking as needed for pain). ) 10 tablet 0 Past Week at Unknown time   Scheduled:  . gabapentin  100 mg Oral QHS    Assessment: 56 yo female with CVA and history of afib. Pharmacy consulted to dose apixiban. Per chart notes- She was on Xarelto in the past but did not tolerate due to bleeding gums and bloody stool -Wt= 109.8kg, SCr= 0.96  Goal of Therapy:  Monitor platelets by anticoagulation protocol: Yes   Plan:  -apixiban 5mg  po bid -Will provide education on 5/21  Harland Germanndrew Koral Thaden, Pharm D 04/06/2017 3:44 PM

## 2017-04-06 NOTE — Progress Notes (Signed)
Family Medicine Teaching Service Daily Progress Note Intern Pager: [phone]  Patient name: Kristi Duncan Medical record number: [id number] Date of birth: [birth year] Age: 56 y.o. Gender: female  Primary Care Provider: Mayo, Allyn Kenner, MD Consultants: Neurology Code Status: Full  Pt Overview and Major Events to Date:  5/19: Admit to FPTS for presumed stroke 5/20: MRI/MRA normal   Assessment and Plan: Kristi Duncan is a 56 y.o. female with a past medical history significant for HTN, HLD, atrial fibrillation, hypothyroidism and Vitamin D deficiency  presenting with right facial droop and numbness concerning for stroke.  Right facial droop, numbness and drooling, acute: Improved. ?TIA. Head CT was within normal limit. MRI/MRA showing no abnormalities. Per neurology note yesterday, it was though that this is an acute ischemic stroke. No facial droop or numbness in face today. Gross sensation intact throughout face however did not test sharp versus soft sensation. --Vitals per floor protocol --Neurology consulted, appreciate recommendations --Follow up on ECHO --Tylenol 650 mg q4 prn  --Monitor vitals --Neuro checks q2 --ASA 81 mg daily   Neck pain with radiation to arm, subacute: MRI of cervical spine showing multiple abnormalities including: multilevel spondylosis with foraminal narrowing worse on the right, Moderate right and mild left foraminal narrowing at C3-4, Mild right foraminal narrowing at C4-5, Moderate foraminal stenosis bilaterally at C5-6, Moderate right and mild left foraminal narrowing at C6-7, Central canal narrowing is greatest at C4-5 and C5-6 with near total effacement of the ventral CSF. Patient continues to have symptoms today that are the same as admission. --Start Gabapentin 100 mg qhs (patient prefers a lower dose as she states she is sensitive to medications) - Will likely need need ortho vs neurosurgery follow up  Chronic Atrial Fibrillation, not  anticoagulated: Was on Xarelto in the past but did not tolerate due to bleeding gums and bloody stool. Patient also report few episodes of heart palpitations on diltiazem for rate control. Patient is at high risk for thromboembolism episodes given  risk factors and lack of therapy. Has been in NSR and rate controlled. --Hold diltiazem 120 bid during neuro work up --Consider other DOAC for anticoagulation given risks  HTN: Normotensive. Permissive HTN for now --Hold amlodipine 10 mg daily and atenolol 25 mg daily  Bradycardia: HR as low as 48 this morning, now in the 50's. Not on any beta blocker. Asymptomatic. - Continue to monitor   Dyspnea on exertion/Lower extremity edema: Patient does not have a history of CHF, but was told that she needed a formal study for evaluation for OSA. Patient report PND and orthopnea that could be consistent with CHF. Patient also endorses mild DOE. Has had new LE edema over the past few weeks.  --Will follow up on ECHO --Possible cardiology consult  --LE edema may be worsened by increase in amlodipine dose, may decrease if BP tolerates in outpatient setting.   HLD, chronic, untreated: Patient has a history of hyperlipidemia and was started on statin (simvastatin and rosuvastatin) therapy in the past but reported severe myalgias and had to stop. Lipid panel on 5/20 showing LDL of 125, Cholesterol 191, HDL 48.  --Patient not agreeable to starting statin at this time as she has had a bad experience in the past  Hypothyroidism: Patient mentioned hypothyroidism and was on levothyroxine, but stoped taking the medication about a year when she was told she did not need medication. TSH today borderline high at 4.8 today --Check T4 for possible subclinical hypothyroidism --May need to start synthroid  if T4 is low --Patient need to follow up with PCP and recheck TSH in 4-6 weeks  Vitamin D deficiency, diet controlled: Patient unable to tolerate supplement, has been  controlled through diet.  FEN/GI: Regular Diet  Prophylaxis: Heparin SQ   Disposition: Pending further workup  Subjective:  Patient had no acute events overnight. She is doing well this morning. No complaints. States that she feels much better than yesterday. Denies any facial weakness or drooling. Denies any numbness in her face. Does admit to intermittent numbness and pain in her right arm which she had on admission. Is urinating appropriately, no bowel movement yet. Good appetite and by mouth intake.  Objective: Temp:  [97.6 F (36.4 C)-98.3 F (36.8 C)] 98 F (36.7 C) (05/20 0905) Pulse Rate:  [48-72] 55 (05/20 0905) Resp:  [15-22] 16 (05/20 0905) BP: (125-164)/(42-80) 135/61 (05/20 0905) SpO2:  [98 %-100 %] 100 % (05/20 0905) Weight:  [242 lb (109.8 kg)] 242 lb (109.8 kg) (05/19 1225) Physical Exam: General: In no acute distress, alert, pleasant Cardiovascular: Regular rate and rhythm, normal S1-S2, trace edema bilateral ankles, palpable DP pulses bilaterally Respiratory: Normal work of breathing, clear to auscultation bilaterally Abdomen: Soft, nondistended, nontender, normal bowel sounds Extremities: Moves all extremities spontaneously Neuro: Alert and oriented to person place and time. No obvious slurred speech. Pupils equal and reactive to light, normal tongue protrusion, midline uvula, symmetric smile, symmetric eyebrow raising, sensation grossly intact throughout on face. 5 out of 5 grip strength bilaterally, sensation grossly intact on arms. 5 out of 5 strength in bilateral lower extremities sensation grossly intact and legs.  Laboratory:  Recent Labs Lab 04/05/17 1239 04/05/17 1303 04/06/17 0603  WBC 4.6  --  4.6  HGB 12.9 12.6 13.0  HCT 38.1 37.0 39.0  PLT 247  --  257    Recent Labs Lab 04/05/17 1239 04/05/17 1303 04/06/17 0603  NA 138 142 140  K 3.8 3.9 3.6  CL 108 106 105  CO2 22  --  26  BUN 7 9 10   CREATININE 0.91 0.90 0.96  CALCIUM 9.1  --  9.2   PROT 7.6  --   --   BILITOT 0.9  --   --   ALKPHOS 52  --   --   ALT 17  --   --   AST 24  --   --   GLUCOSE 103* 102* 97    TSH: 4.867  Lipid Panel     Component Value Date/Time   CHOL 191 04/06/2017 0603   TRIG 92 04/06/2017 0603   HDL 48 04/06/2017 0603   CHOLHDL 4.0 04/06/2017 0603   VLDL 18 04/06/2017 0603   LDLCALC 125 (H) 04/06/2017 0603    Imaging/Diagnostic Tests: Ct Head Wo Contrast  Result Date: 04/05/2017 CLINICAL DATA:  Patient has had pain down left arm for 2 weeks with out injury EXAM: CT HEAD WITHOUT CONTRAST TECHNIQUE: Contiguous axial images were obtained from the base of the skull through the vertex without intravenous contrast. COMPARISON:  None. FINDINGS: Brain: No evidence of acute infarction, hemorrhage, hydrocephalus, extra-axial collection or mass lesion/mass effect. Vascular: No hyperdense vessel or unexpected calcification. Skull: Normal. Negative for fracture or focal lesion. Sinuses/Orbits: Normal globes and orbits. The visualized sinuses and mastoid air cells are clear. Other: None. IMPRESSION: Normal unenhanced CT scan of the brain. Electronically Signed   By: Amie Portland M.D.   On: 04/05/2017 13:13   Mr Brain Wo Contrast  Result Date: 04/06/2017 CLINICAL  DATA:  There loss of hypertension right-sided onset of facial numbness at right upper extremity weakness. EXAM: MRI HEAD WITHOUT CONTRAST MRA HEAD WITHOUT CONTRAST TECHNIQUE: Multiplanar, multiecho pulse sequences of the brain and surrounding structures were obtained without intravenous contrast. Angiographic images of the head were obtained using MRA technique without contrast. COMPARISON:  CT head without contrast 04/05/2017 FINDINGS: MRI HEAD FINDINGS Brain: The diffusion-weighted images demonstrate no acute or subacute infarct. No significant white matter disease is present. There is no acute hemorrhage or mass lesion. Ventricles are of normal size. No significant extraaxial fluid collection is  present. Vascular: Flow is present in the major intracranial arteries. Skull and upper cervical spine: The skullbase is within normal limits. Incidental note is made of intra parotid lymph nodes bilaterally. The craniocervical junction is normal. Midline sagittal structures are unremarkable. Sinuses/Orbits: The paranasal sinuses and mastoid air cells are clear. The globes and orbits are within normal limits. MRA HEAD FINDINGS Internal carotid arteries are within normal limits from the high cervical segments through the ICA termini bilaterally. The A1 and M1 segments normal. The anterior communicating artery is patent. ACA and MCA branch vessels are within normal limits. The vertebral arteries are codominant. The PICA origins are visualized normal. The basilar artery is within normal limits. A fetal type right posterior cerebral artery is present. A right P1 segment is present. The left posterior cerebral artery originates from the basilar tip. PCA branch vessels are within normal limits. IMPRESSION: 1. Normal MRI appearance of the brain. No acute or focal abnormality to explain the patient's weakness and facial droop. 2. Normal variant MRA circle of Willis without significant proximal stenosis, aneurysm, or branch vessel occlusion. Electronically Signed   By: Marin Roberts M.D.   On: 04/06/2017 07:14   Mr Cervical Spine Wo Contrast  Result Date: 04/06/2017 CLINICAL DATA:  Neck pain. Right arm pain, numbness, and weakness. Facial numbness. EXAM: MRI CERVICAL SPINE WITHOUT CONTRAST TECHNIQUE: Multiplanar, multisequence MR imaging of the cervical spine was performed. No intravenous contrast was administered. COMPARISON:  MRI brain from the same day. CT of the head 04/05/2017. FINDINGS: Alignment: AP alignment is anatomic. There is some straightening of the normal cervical lordosis. Vertebrae: Endplate marrow changes are evident at C5-6 and to lesser degree C4-5. Vertebral body heights are maintained. Cord:  Normal signal is present in the cervical and upper thoracic spinal cord to the lowest imaged level, T2-3. Posterior Fossa, vertebral arteries, paraspinal tissues: The craniocervical junction is normal. Visualized intracranial contents are normal. Flow is present in the vertebral arteries bilaterally. Disc levels: C2-3: Mild uncovertebral spurring is present on the right without significant stenosis. C3-4: A broad-based disc osteophyte complex partially effaces the ventral CSF. Uncovertebral spurring is worse on the right. Facet hypertrophy is worse on the left. Moderate right and mild left foraminal narrowing is present. C4-5: A broad-based disc osteophyte complex is asymmetric to the right. There is partial effacement of the ventral CSF. Mild facet hypertrophy is noted bilaterally. Uncovertebral spurring results in mild right foraminal stenosis. C5-6: A broad-based disc osteophyte complex partially effaces the ventral CSF. Uncovertebral a spurring leads to moderate foraminal narrowing bilaterally. C6-7: A rightward disc osteophyte complex is present. Uncovertebral and facet hypertrophy contribute to moderate right and mild left foraminal narrowing. There is partial effacement of the ventral CSF. C7-T1:  Negative. IMPRESSION: 1. Multilevel spondylosis of the cervical spine with foraminal narrowing worse on the right. 2. Moderate right and mild left foraminal narrowing at C3-4. 3. Mild right foraminal  narrowing at C4-5. 4. Moderate foraminal stenosis bilaterally at C5-6. 5. Moderate right and mild left foraminal narrowing at C6-7. 6. Central canal narrowing is greatest at C4-5 and C5-6 with near total effacement of the ventral CSF. Electronically Signed   By: Marin Robertshristopher  Mattern M.D.   On: 04/06/2017 07:21   Mr Maxine GlennMra Head/brain ZOWo Cm  Result Date: 04/06/2017 CLINICAL DATA:  There loss of hypertension right-sided onset of facial numbness at right upper extremity weakness. EXAM: MRI HEAD WITHOUT CONTRAST MRA HEAD  WITHOUT CONTRAST TECHNIQUE: Multiplanar, multiecho pulse sequences of the brain and surrounding structures were obtained without intravenous contrast. Angiographic images of the head were obtained using MRA technique without contrast. COMPARISON:  CT head without contrast 04/05/2017 FINDINGS: MRI HEAD FINDINGS Brain: The diffusion-weighted images demonstrate no acute or subacute infarct. No significant white matter disease is present. There is no acute hemorrhage or mass lesion. Ventricles are of normal size. No significant extraaxial fluid collection is present. Vascular: Flow is present in the major intracranial arteries. Skull and upper cervical spine: The skullbase is within normal limits. Incidental note is made of intra parotid lymph nodes bilaterally. The craniocervical junction is normal. Midline sagittal structures are unremarkable. Sinuses/Orbits: The paranasal sinuses and mastoid air cells are clear. The globes and orbits are within normal limits. MRA HEAD FINDINGS Internal carotid arteries are within normal limits from the high cervical segments through the ICA termini bilaterally. The A1 and M1 segments normal. The anterior communicating artery is patent. ACA and MCA branch vessels are within normal limits. The vertebral arteries are codominant. The PICA origins are visualized normal. The basilar artery is within normal limits. A fetal type right posterior cerebral artery is present. A right P1 segment is present. The left posterior cerebral artery originates from the basilar tip. PCA branch vessels are within normal limits. IMPRESSION: 1. Normal MRI appearance of the brain. No acute or focal abnormality to explain the patient's weakness and facial droop. 2. Normal variant MRA circle of Willis without significant proximal stenosis, aneurysm, or branch vessel occlusion. Electronically Signed   By: Marin Robertshristopher  Mattern M.D.   On: 04/06/2017 07:14     Beaulah DinningGambino, Donica Derouin M, MD 04/06/2017, 9:09  AM PGY-2, [facility] FPTS Intern pager: [phone], text pages welcome

## 2017-04-06 NOTE — Progress Notes (Signed)
Pt sent down for MRI .CMT made aware,

## 2017-04-06 NOTE — Progress Notes (Addendum)
STROKE TEAM PROGRESS NOTE   HISTORY OF PRESENT ILLNESS (per record) This is a 56 right-handed woman who presents to the emergency department for evaluation of right facial numbness and drooling. History is obtained directly from the patient was a good historian. Her family is present at the bedside and offer additional information as needed.  The patient reports that she was in her usual state of health until this morning at about 10 AM. She states that she noticed that she was having some numbness in the right side of her face and also felt as if she was drooling from the right side of her mouth. The numbness was also involving the right arm and leg. She did not have any weakness with these symptoms. She denies any speech or vision changes. Symptoms may be a little better though overall she feels that they're largely unchanged compared to their onset. She denies any prior similar symptoms.  She has had some pain and discomfort in the right arm which been present for a couple of weeks. She describes a pins and needle sensation involving the entire right arm from shoulder down to the fingertips. She says this is associated with another feeling where it feels as if something hot is being poured over her arm and says that the sensation moves down her arm from shoulder to hand like a wave. She says the worst of her pain is between the shoulder and the elbow. This is unchanged today. She was seen by her PCP yesterday and was given a prescription for gabapentin 300 mg 3 times daily. She took 300 mg last night and a second 300 mg tablet this morning. This morning, she said that she feels very out of sorts. She says is hard for her to focus. Her daughter says that she does not seem herself today and seems very slow to process things.  Last known well: 10 AM today NHISS score: 1 mRS score: 0 tPA given?: No, minor symptoms, outside window   SUBJECTIVE (INTERVAL HISTORY) Her daughter is at the bedside.  Kristi Duncan  sitting in bed, recounted HPI with me. She has been seen by Dr. Elberta Fortis last year for afib, but kept on ASA due to hx of gum bleeding and bloody stool. She then got dental issue fixed and no bloody stool any more. This time came in with right facial numbness and drooling for 2 hours in the setting of headache, right arm pain for 2 weeks, wax and wane. Now resolved. MRI negative.    OBJECTIVE Temp:  [97.6 F (36.4 C)-98.3 F (36.8 C)] 97.6 F (36.4 C) (05/20 0600) Pulse Rate:  [48-72] 48 (05/20 0600) Cardiac Rhythm: Sinus bradycardia (05/19 1900) Resp:  [15-22] 16 (05/20 0600) BP: (125-164)/(42-80) 129/42 (05/20 0600) SpO2:  [98 %-100 %] 100 % (05/20 0600) Weight:  [109.8 kg (242 lb)] 109.8 kg (242 lb) (05/19 1225)  CBC:  Recent Labs Lab 04/05/17 1239 04/05/17 1303 04/06/17 0603  WBC 4.6  --  4.6  NEUTROABS 2.8  --   --   HGB 12.9 12.6 13.0  HCT 38.1 37.0 39.0  MCV 85.0  --  84.8  PLT 247  --  257    Basic Metabolic Panel:  Recent Labs Lab 04/05/17 1239 04/05/17 1303 04/06/17 0603  NA 138 142 140  K 3.8 3.9 3.6  CL 108 106 105  CO2 22  --  26  GLUCOSE 103* 102* 97  BUN 7 9 10   CREATININE 0.91 0.90 0.96  CALCIUM  9.1  --  9.2    Lipid Panel:    Component Value Date/Time   CHOL 191 04/06/2017 0603   TRIG 92 04/06/2017 0603   HDL 48 04/06/2017 0603   CHOLHDL 4.0 04/06/2017 0603   VLDL 18 04/06/2017 0603   LDLCALC 125 (H) 04/06/2017 0603   HgbA1c:  Lab Results  Component Value Date   HGBA1C 5.3 09/27/2016   Urine Drug Screen: No results found for: LABOPIA, COCAINSCRNUR, LABBENZ, AMPHETMU, THCU, LABBARB  Alcohol Level No results found for: ETH  IMAGING I have personally reviewed the radiological images below and agree with the radiology interpretations.  Ct Head Wo Contrast 04/05/2017 Normal unenhanced CT scan of the brain.   Mr [name] Head/brain Wo Cm 04/06/2017 1. Normal MRI appearance of the brain. No acute or focal abnormality to explain the patient's  weakness and facial droop.  2. Normal variant MRA circle of Willis without significant proximal stenosis, aneurysm, or branch vessel occlusion.   Mr Cervical Spine Wo Contrast 04/06/2017 1. Multilevel spondylosis of the cervical spine with foraminal narrowing worse on the right.  2. Moderate right and mild left foraminal narrowing at C3-4.  3. Mild right foraminal narrowing at C4-5.  4. Moderate foraminal stenosis bilaterally at C5-6.  5. Moderate right and mild left foraminal narrowing at C6-7.  6. Central canal narrowing is greatest at C4-5 and C5-6 with near total effacement of the ventral CSF.   CUS pending  TTE - Left ventricle: The cavity size was normal. Wall thickness was   increased in a pattern of mild LVH. Systolic function was normal.   The estimated ejection fraction was in the range of 60% to 65%.   Wall motion was normal; there were no regional wall motion   abnormalities. Features are consistent with a pseudonormal left   ventricular filling pattern, with concomitant abnormal relaxation   and increased filling pressure (grade 2 diastolic dysfunction). - Mitral valve: There was mild regurgitation. - Left atrium: The atrium was at the upper limits of normal in   size. - Right atrium: Central venous pressure (est): 3 mm Hg. - Tricuspid valve: There was physiologic regurgitation. - Pulmonary arteries: Systolic pressure could not be accurately   estimated. - Pericardium, extracardiac: There was no pericardial effusion. Impressions: - Mild LVH with LVEF 60-65% with grade 2 diastolic dysfunction.   Upper normal left atrial size. Mild mitral regurgitation.   PHYSICAL EXAM  Temp:  [97.6 F (36.4 C)-98.3 F (36.8 C)] 98 F (36.7 C) (05/20 0905) Pulse Rate:  [48-72] 55 (05/20 0905) Resp:  [15-22] 16 (05/20 0905) BP: (125-164)/(42-80) 135/61 (05/20 0905) SpO2:  [98 %-100 %] 100 % (05/20 0905)  General - Well nourished, well developed, in no apparent  distress.  Ophthalmologic - Fundi not visualized due to noncooperation.  Cardiovascular - Regular rate and rhythm, not in afib.  Mental Status -  Level of arousal and orientation to time, place, and person were intact. Language including expression, naming, repetition, comprehension was assessed and found intact. Attention span and concentration were normal. Fund of Knowledge was assessed and was intact.  Cranial Nerves II - XII - II - Visual field intact OU. III, IV, VI - Extraocular movements intact. V - Facial sensation intact bilaterally. VII - Facial movement intact bilaterally. VIII - Hearing & vestibular intact bilaterally. X - Palate elevates symmetrically. XI - Chin turning & shoulder shrug intact bilaterally. XII - Tongue protrusion intact.  Motor Strength - The patient's strength was normal in  all extremities and pronator drift was absent.  Bulk was normal and fasciculations were absent.   Motor Tone - Muscle tone was assessed at the neck and appendages and was normal.  Reflexes - The patient's reflexes were 1+ in all extremities and she had no pathological reflexes  Sensory - Light touch, temperature/pinprick were assessed and were symmetrical.    Coordination - The patient had normal movements in the hands with no ataxia or dysmetria.  Tremor was absent.  Gait and Station - deferred   ASSESSMENT/PLAN Ms. Kristi Duncan is a 56 y.o. female with history of thyroid disease, hypertension, hyperlipidemia, chronic back pain, and atrial fibrillation without anticoagulation presenting with right sided numbness, pain, and drooling. She did not receive IV t-PA due to late presentation and minor symptoms.  Possible TIA vs. Complicated headache/migarine   CT head - normal  MRI head - normal  MRA head - normal  Carotid Doppler - pending  2D Echo - EF 60-65%. No cardiac source of emboli identified.  LDL - 125  HgbA1c - pending  VTE prophylaxis -  SCDs  Diet  Heart Room service appropriate? Yes; Fluid consistency: Thin  No antithrombotic prior to admission, now on aspirin 81 mg daily. Due to possible TIA in the setting of PAF, recommend anticoagulation. Kristi Duncan is willing to try eliquis 5mg  bid.  Patient counseled to be compliant with her antithrombotic medications  Ongoing aggressive stroke risk factor management  Therapy recommendations: pending  Disposition:  Pending  afib not on AC  [place]   Not tolerating Xarelto due to gum bleeding and bloody stools  [facility]  Currently on ASA  Had dental treatment and no more bleeding  Due to possibility of TIA, recommend to resume AC. Kristi Duncan is willing to try eliquis 5mg  bid.  Cervical radiculopathy  Kristi Duncan right arm pain x 2 weeks  MRI C-spine severe foraminal narrowing R>L  Need outpt Kristi Duncan  Need EMG/NCS at outpt  May consider Neurosurgery referral if needed.  Hypertension  Stable  Long-term BP goal normotensive  Hyperlipidemia  Home meds:  No lipid lowering medications prior to admission  LDL 125, goal < 70  Add pravastatin 20mg  due to other statin intolerance  Continue statin at discharge  Other Stroke Risk Factors  Former cigarette smoker - quit in 1993  ETOH use, advised to drink no more than 1 drink per day.  Obesity, Body mass index is 36.8 kg/m., recommend weight loss, diet and exercise as appropriate   Family hx stroke (grandfather)  Other Active Problems  Hx of Xarelto intolerance -> bleeding gums and blood in stool  Contrast dye allergy  Bradycardia - 40's - 60's   Hospital day # 1  Neurology will sign off. Please call with questions. Krist[facility] Duncan [facility]. Thanks for the consult.   Marvel PlanJindong Broghan Pannone, MD PhD Stroke Neurology 04/06/2017 3:51 PM   To contact Stroke Continuity provider, please refer to [email or web address]. After hours, contact General Neurology

## 2017-04-06 NOTE — Progress Notes (Signed)
Pt just got back from MRI, c/o headache and medicated with tylenol.. RN will continue to monitor.

## 2017-04-06 NOTE — Evaluation (Signed)
Occupational Therapy Evaluation and Discharge Patient Details Name: Kristi Duncan MRN: [id number] DOB: 06-18-1961 Today's Date: 04/06/2017    History of Present Illness 56 y.o. female with a past medical history significant for HTN, HLD, atrial fibrillation, hypothyroidism and Vitamin D deficiency  presenting with right facial droop and numbness concerning for stroke., neuro workup underway.   Clinical Impression   PTA Pt independent in ADL and mobility. Pt currently mod I (for increased time) for ADL and mobility. Pt able to demonstrate no functional deficits with RUE. Pt reports that it is still tingling and that the sensation goes in waves at random times. Educated Pt on importance of safety during cooking and with hot water. Pt verbally acknowledged and stated that she had been dealing with that for 2 weeks before it traveled up into her neck and face (which is when she came in). OT educated about possiblility of OPOT with access to different modalities for de-sensitization, but Pt declined at this time as she is completely functional. No further OT needs at this time. OT to sign off, however should symptoms increased, or impact function, please feel free to re-order. Thank you for this referral.    Follow Up Recommendations  No OT follow up    Equipment Recommendations  None recommended by OT    Recommendations for Other Services       Precautions / Restrictions Restrictions Weight Bearing Restrictions: No      Mobility Bed Mobility Overal bed mobility: Modified Independent             General bed mobility comments: increased time  Transfers Overall transfer level: Modified independent               General transfer comment: increased time    Balance Overall balance assessment: No apparent balance deficits (not formally assessed)                             High Level Balance Comments: no cues or physical assist required           ADL  either performed or assessed with clinical judgement   ADL Overall ADL's : Modified independent                                       General ADL Comments: Able to ambulate to the bathroom, feeding herself lunch when OT entered, no problems gripping fork, coordinating movements to eat rice and bring hand from plate to mouth     Vision Baseline Vision/History: Wears glasses Wears Glasses: At all times Patient Visual Report: No change from baseline Vision Assessment?: No apparent visual deficits     Perception     Praxis      Pertinent Vitals/Pain Pain Assessment: 0-10 Pain Score: 4  Pain Location: headache, neck and arm during "waves" Pain Descriptors / Indicators: Headache;Burning;Pins and needles Pain Intervention(s): Monitored during session     Hand Dominance Right   Extremity/Trunk Assessment Upper Extremity Assessment Upper Extremity Assessment: RUE deficits/detail RUE Deficits / Details: slight lag during forward flexion and hold (with eyes closed) grossly 4/5 strength but functional and able to open and close snaps, write, tie shoe RUE Sensation: decreased light touch   Lower Extremity Assessment Lower Extremity Assessment: Overall WFL for tasks assessed   Cervical / Trunk Assessment Cervical / Trunk Assessment: Normal   Communication  Communication Communication: No difficulties   Cognition Arousal/Alertness: Awake/alert Behavior During Therapy: WFL for tasks assessed/performed Overall Cognitive Status: Within Functional Limits for tasks assessed                                     General Comments  Pt had 2 family members present, Pt wished therapy to proceed with them in the room    Exercises     Shoulder Instructions      Home Living Family/patient expects to be discharged to:: Private residence Living Arrangements: Alone Available Help at Discharge: Family Type of Home: House Home Access: Level entry     Home  Layout: Two level Alternate Level Stairs-Number of Steps: 13 Alternate Level Stairs-Rails: Can reach both Bathroom Shower/Tub: Tub/shower unit;Curtain   FirefighterBathroom Toilet: Standard     Home Equipment: Cane - single point   Additional Comments: does not use the cane      Prior Functioning/Environment Level of Independence: Independent                 OT Problem List: Decreased coordination;Impaired sensation;Pain      OT Treatment/Interventions:      OT Goals(Current goals can be found in the care plan section) Acute Rehab OT Goals Patient Stated Goal: to get home OT Goal Formulation: With patient Time For Goal Achievement: 04/17/17 Potential to Achieve Goals: Good  OT Frequency:     Barriers to D/C:            Co-evaluation              AM-PAC PT "6 Clicks" Daily Activity     Outcome Measure Help from another person eating meals?: None Help from another person taking care of personal grooming?: None Help from another person toileting, which includes using toliet, bedpan, or urinal?: None Help from another person bathing (including washing, rinsing, drying)?: None Help from another person to put on and taking off regular upper body clothing?: None Help from another person to put on and taking off regular lower body clothing?: None 6 Click Score: 24   End of Session Nurse Communication: Mobility status;Other (comment) (OT to sign off)  Activity Tolerance: Patient tolerated treatment well Patient left: in bed;with call bell/phone within reach;with family/visitor present (sitting EOB to finish eating)  OT Visit Diagnosis: Other symptoms and signs involving the nervous system (Z61.096(R29.898)                Time: [id number] OT Time Calculation (min): 12 min Charges:  OT General Charges $OT Visit: 1 Procedure OT Evaluation $OT Eval Low Complexity: 1 Procedure G-Codes:     Sherryl MangesLaura Elienai Gailey OTR/L [phone]  Evern BioLaura J Noya Santarelli 04/06/2017, 2:06 PM

## 2017-04-06 NOTE — Progress Notes (Addendum)
Patient refuses pravastatin, states she got very sick previously. Primary MD notified. Patient educated on low cholesterol, heart healthy diet, exercise.

## 2017-04-07 ENCOUNTER — Inpatient Hospital Stay (HOSPITAL_COMMUNITY): Payer: Self-pay

## 2017-04-07 ENCOUNTER — Encounter (HOSPITAL_COMMUNITY): Payer: Self-pay | Admitting: *Deleted

## 2017-04-07 DIAGNOSIS — G459 Transient cerebral ischemic attack, unspecified: Secondary | ICD-10-CM

## 2017-04-07 DIAGNOSIS — R29898 Other symptoms and signs involving the musculoskeletal system: Secondary | ICD-10-CM

## 2017-04-07 LAB — VAS US CAROTID
LCCAPSYS: 127 cm/s
LEFT ECA DIAS: -18 cm/s
LEFT VERTEBRAL DIAS: 19 cm/s
Left CCA dist dias: -27 cm/s
Left CCA dist sys: -119 cm/s
Left CCA prox dias: 21 cm/s
Left ICA dist dias: -33 cm/s
Left ICA dist sys: -94 cm/s
Left ICA prox dias: -18 cm/s
Left ICA prox sys: -84 cm/s
RCCADSYS: -128 cm/s
RCCAPDIAS: 11 cm/s
RIGHT ECA DIAS: -15 cm/s
RIGHT VERTEBRAL DIAS: 18 cm/s
Right CCA prox sys: 94 cm/s

## 2017-04-07 LAB — HEMOGLOBIN A1C
HEMOGLOBIN A1C: 5.6 % (ref 4.8–5.6)
Mean Plasma Glucose: 114 mg/dL

## 2017-04-07 MED ORDER — GABAPENTIN 100 MG PO CAPS: 100.0000 mg | ORAL_CAPSULE | Freq: Every day | ORAL | 0 refills | Status: DC

## 2017-04-07 MED ORDER — HYDROMORPHONE HCL 2 MG PO TABS
2.0000 mg | ORAL_TABLET | Freq: Once | ORAL | Status: AC
  Administered 2017-04-07: 2 mg via ORAL
  Filled 2017-04-07: qty 1

## 2017-04-07 MED ORDER — ATENOLOL 25 MG PO TABS
25.0000 mg | ORAL_TABLET | Freq: Every day | ORAL | Status: DC
  Filled 2017-04-07: qty 1

## 2017-04-07 MED ORDER — APIXABAN 5 MG PO TABS: 5.0000 mg | ORAL_TABLET | Freq: Two times a day (BID) | ORAL | 0 refills | Status: DC

## 2017-04-07 MED ORDER — PRAVASTATIN SODIUM 20 MG PO TABS: 20.0000 mg | ORAL_TABLET | Freq: Every evening | ORAL | 3 refills | Status: DC

## 2017-04-07 NOTE — Progress Notes (Signed)
Family Medicine Teaching Service Daily Progress Note Intern Pager: [phone]  Patient name: Kristi Duncan Medical record number: [id number] Date of birth: 06/06/1961 Age: 56 y.o. Gender: female  Primary Care Provider: Mayo, Allyn Kenner, MD Consultants: Neurology Code Status: Full  Pt Overview and Major Events to Date:  5/19: Admit to FPTS for presumed stroke  5/20: MRI/MRA normal   Assessment and Plan: Kristi Duncan is a 56 y.o. female with a past medical history significant for HTN, HLD, atrial fibrillation, hypothyroidism and Vitamin D deficiency  presenting with right facial droop and numbness concerning for stroke.  Right facial droop, numbness and drooling, acute:  Stroke has been ruled out, likely TIA.  Symptoms have resolved and patient appears at her baseline this morning. On my exam, strength bilaterally equal in upper and lower extremities with no numbness or tingling.  Speech normal and no facial droop.  Head CT wnl and MRI/MRA showing no abnormalities. MRI C-spine with degenerative changes and severe foraminal narrowing R>L.   LDL 125.   --Neurology consulted, appreciate recommendations >> Start Eliquis 5 mg BID in setting of PAF and patient not on anticoagulation (not tolerating Xarelto 2/2 bleeding), consider neurosurgery referral for cervical radiculopathy (MRI c-spine with severe foraminal narrowing R>L), EMG/NCS as outpatient, will add pravastatin 20 mg due to other statin intolerance .  Neuro will sign off with f/u in stroke clinic in ~6 weeks.  Will clarify if needs to continue Aspirin.   --Echo 60-65%, wall motion normal, G2DD.  No cardiac source of emboli identified.   - Carotid doppler - preliminary report: 1-39% internal carotid artery stenosis bilaterally.   --Tylenol 650 mg q4 prn   --Neuro checks q4 --ASA 81 mg daily  -PT/OT recs - no follow up recommendations --Vitals per floor protocol  Neck pain with radiation to arm, subacute: MRI of cervical spine showing  multiple abnormalities including: multilevel spondylosis with foraminal narrowing worse on the right, Moderate right and mild left foraminal narrowing at C3-4, Mild right foraminal narrowing at C4-5, Moderate foraminal stenosis bilaterally at C5-6, Moderate right and mild left foraminal narrowing at C6-7, Central canal narrowing is greatest at C4-5 and C5-6 with near total effacement of the ventral CSF. Patient continues to have symptoms today that are the same as admission. --Start Gabapentin 100 mg qhs (patient prefers a lower dose as she states she is sensitive to medications)  Chronic Atrial Fibrillation, not anticoagulated: Was on Xarelto in the past but did not tolerate due to bleeding gums and bloody stool. Patient also report few episodes of heart palpitations on diltiazem for rate control. Patient is at high risk for thromboembolism episodes given  risk factors and lack of therapy. Has been in NSR and rate controlled.  --Hold diltiazem 120 bid during neuro work up [employer] Eliquis 5 mg BID   HTN: 156/66 this AM.  Permissive HTN for now.   At home on  amlodipine 10 mg daily and atenolol 25 mg daily -Restart Atenolol this AM for rate control  -Can restart Amlodipine at follow up appointment   Bradycardia: Resolved.  HR 64-66 this AM however bradycardic on admission.  Not on any beta blocker. Asymptomatic. - Continue to monitor   Dyspnea on exertion/Lower extremity edema: Patient does not have a history of CHF, but was told that she needed a formal study for evaluation for OSA. Patient report PND and orthopnea that could be consistent with CHF. Patient also endorses mild DOE. Has had new LE edema over the past  few weeks.  --Echo 60-65%, wall motion normal, G2DD --LE edema may be worsened by increase in amlodipine dose, may decrease if BP tolerates in outpatient setting.   HLD, chronic, untreated: Patient has a history of hyperlipidemia and was started on statin (simvastatin and  rosuvastatin) therapy in the past but reported severe myalgias and had to stop. Lipid panel on 5/20 showing LDL of 125, Cholesterol 191, HDL 48.  --Patient not agreeable to starting statin at this time as she has had a bad experience in the past  -Consider Pravastatin 20 mg daily, per neuro rec  Hypothyroidism: Patient mentioned hypothyroidism and was on levothyroxine, but stoped taking the medication about a year when she was told she did not need medication. TSH today borderline high at 4.8 today with normal T4 of 0.86.   --Patient need to follow up with PCP and recheck TSH in 4-6 weeks   Vitamin D deficiency, diet controlled: Patient unable to tolerate supplement, has been controlled through diet.  FEN/GI: Regular Diet  Prophylaxis: Heparin SQ   Disposition: Discharge home today.   Subjective:  No acute events overnight. She feels better this morning and denies concerns. No facial weakness or drooling. Speech is normal.  Eating breakfast.    Objective: Temp:  [97.7 F (36.5 C)-98.6 F (37 C)] 97.8 F (36.6 C) (05/21 0957) Pulse Rate:  [57-66] 62 (05/21 0957) Resp:  [16-19] 19 (05/21 0957) BP: (128-156)/(40-66) 129/48 (05/21 0957) SpO2:  [98 %-100 %] 100 % (05/21 0957) Physical Exam: General: pleasant 56 yo F, NAD   Cardiovascular: RRR no MRG, palpable pulses  Respiratory: CTAB, normal work of breathing  Abdomen: Soft, NTND, +bs  Extremities: ROM normal  Neuro: AOx3, speech is normal, CN II-XII intact.  Symmetry noted with facial expressions.  5/5 strength bilaterally in upper and lower extremities.   Laboratory:  Recent Labs Lab 04/05/17 1239 04/05/17 1303 04/06/17 0603  WBC 4.6  --  4.6  HGB 12.9 12.6 13.0  HCT 38.1 37.0 39.0  PLT 247  --  257    Recent Labs Lab 04/05/17 1239 04/05/17 1303 04/06/17 0603  NA 138 142 140  K 3.8 3.9 3.6  CL 108 106 105  CO2 22  --  26  BUN 7 9 10   CREATININE 0.91 0.90 0.96  CALCIUM 9.1  --  9.2  PROT 7.6  --   --    BILITOT 0.9  --   --   ALKPHOS 52  --   --   ALT 17  --   --   AST 24  --   --   GLUCOSE 103* 102* 97    TSH: 4.867  Lipid Panel     Component Value Date/Time   CHOL 191 04/06/2017 0603   TRIG 92 04/06/2017 0603   HDL 48 04/06/2017 0603   CHOLHDL 4.0 04/06/2017 0603   VLDL 18 04/06/2017 0603   LDLCALC 125 (H) 04/06/2017 0603    Imaging/Diagnostic Tests: No results found.   Freddrick MarchAmin, Gloristine Turrubiates, MD 04/07/2017, 12:10 PM PGY-1, [facility] FPTS Intern pager: [phone], text pages welcome

## 2017-04-07 NOTE — Progress Notes (Signed)
Patient is discharged from room 5M19 at this time. Alert and in stable condition. IV site d/c'd as well as tele. Instructions read to patient and daughter with understanding verbalized. Left unit via wheelchair with all belongings at side.

## 2017-04-07 NOTE — Progress Notes (Signed)
*  PRELIMINARY RESULTS* Vascular Ultrasound Carotid Duplex (Doppler) has been completed.  Findings suggest 1-39% internal carotid artery stenosis bilaterally. Vertebral arteries are patent with antegrade flow.  04/07/2017 9:05 AM Gertie FeyMichelle Vallie Teters, BS, RVT, RDCS, RDMS

## 2017-04-07 NOTE — Care Management Note (Signed)
Case Management Note  Patient Details  Name: Kristi EasternMazie F Felan MRN: [id number] Date of Birth: 10-17-1961  Subjective/Objective:                    Action/Plan: Pt discharging home with self care. No f/u per PT/OT. [facility]. Pt states they try and keep her medications affordable. CM provided her coupons for her new medications.  Pt also started on Eliquis. CM provided her a 30 day free card and $10 copay card. Patient to call the Eliquis number to see if she qualifies for the assistance.  Pt has transportation home.   Expected Discharge Date:  04/07/17               Expected Discharge Plan:  Home/Self Care  In-House Referral:     Discharge planning Services  CM Consult, Medication Assistance  Post Acute Care Choice:    Choice offered to:     DME Arranged:    DME Agency:     HH Arranged:    HH Agency:     Status of Service:  Completed, signed off  If discussed at [employer] Length of Stay Meetings, dates discussed:    Additional Comments:  Kermit BaloKelli F Karin Pinedo, RN 04/07/2017, 8:12 PM

## 2017-04-07 NOTE — Discharge Instructions (Signed)

## 2017-04-07 NOTE — Progress Notes (Signed)
Patient c/o severe lower back pain which is just started this am but is not a new symptons. Pt stated she used to have back pain and this may be fl airing up because of the Hospital  bed  . Medicated with tylenol now and to call if no relief.

## 2017-04-09 ENCOUNTER — Ambulatory Visit (HOSPITAL_COMMUNITY): Admission: RE | Admit: 2017-04-09 | Payer: Self-pay | Source: Ambulatory Visit

## 2017-04-09 NOTE — Discharge Summary (Signed)
Family Medicine Teaching [facility] Discharge Summary  Patient name: Kristi Duncan Medical record number: [id number] Date of birth: 11/29/1960 Age: 55 y.o. Gender: female Date of Admission: 04/05/2017  Date of Discharge: 04/07/2017 Admitting Physician: Latrelle Dodrill, MD  Primary Care Provider: Campbell Stall, MD Consultants: Neurology   Indication for Hospitalization: numbness, drooling   Discharge Diagnoses/Problem List:  TIA Foraminal stenosis of cervical spine Chronic atrial fibrillation HTN Dyspnea LE edema HLD Hypothyroidism  Disposition: Home  Discharge Condition: Stable, improved.  Discharge Exam:  General: pleasant 56 yo F, NAD   Cardiovascular: RRR no MRG, palpable pulses  Respiratory: CTAB, normal work of breathing  Abdomen: Soft, NTND, +bs  Extremities: ROM normal  Neuro: AOx3, speech is normal, CN II-XII intact.  Symmetry noted with facial expressions.  5/5 strength bilaterally in upper and lower extremities.   Brief Hospital Course:  Patient is a 56 yo F who presented with acute onset of right-sided facial numbness and drooling, in setting of R-sided arm numbness/pain for two weeks.  Admitted to FPTS for stroke workup.  CT head normal.  MRI overnight was negative for stroke, and facial symptoms have since resolved. Her symptoms were concerning given she has atrial fibrillation not on anticoagulation and hyperlipidemia and not on statin therapy Given quick resolution of symptoms, presentation is more consistent with TIA than ischemic stroke though likely. Neurology was consulted and agreed likely TIA. Additionally, her R-sided arm symptoms likely related to cervical radiculopathy, given MRI c-spine findings of foraminal narrowing. Echo with EF 60-65% and no cardiac source of emboli identified.  Carotid dopplers were negative.  She was seen by PT/OT who noted she did not require follow up recommendations.  She was cleared for discharge from Neurology  standpoint on Eliquis.    Of note, patient with history of A-fib and not anticoagulated due to bleeding intolerance in the past on Xarelto. However she was agreeable to try eliquis and discharged on this.    Issues for Follow Up:  1. Patient discharged on Pravastatin 20 mg daily however stated she experienced muscle soreness with statins in the past and does not wish to take it.   2. Patient started on Eliquis 5 mg BID for atrial fibrillation.   Ensure she is taking and follow up to see if she has noted any bleeding on this.   3. Home Gabapentin held on admission due to concern for encephalopathy and restarted at lower dose of 100 mg QHS.  Follow up neck pain and may titrate up if necessary.   4. Hypertensive on admission and permissive hypertension was allowed.  On discharge, Atenolol 25 daily was started for rate control and Amlodipine held.  Can restart Amlodipine at follow-up appointment if needed.  5. Monitor HR.  Patient bradycardic on hospitalization and is asymptomatic.   6. Patient with hypothyroidism and on Levothyroxine in the past but stopped taking it as she was told she no longer needed to take it.  TSH checked on hospitalization and borderline high at 4.8 with normal T4 of 0.86.  Patient needs to follow up with PCP and recheck TSH in 4-6 weeks   Significant Procedures: None  Significant Labs and Imaging:   Recent Labs Lab 04/05/17 1239 04/05/17 1303 04/06/17 0603  WBC 4.6  --  4.6  HGB 12.9 12.6 13.0  HCT 38.1 37.0 39.0  PLT 247  --  257    Recent Labs Lab 04/05/17 1239 04/05/17 1303 04/06/17 0603  NA 138 142 140  K 3.8 3.9 3.6  CL 108 106 105  CO2 22  --  26  GLUCOSE 103* 102* 97  BUN 7 9 10   CREATININE 0.91 0.90 0.96  CALCIUM 9.1  --  9.2  ALKPHOS 52  --   --   AST 24  --   --   ALT 17  --   --   ALBUMIN 4.3  --   --    Results/Tests Pending at Time of Discharge: None  Discharge Medications:  Allergies as of 04/07/2017      Reactions   Iodine Hives    Codeine Nausea And Vomiting   Hctz [hydrochlorothiazide] Other (See Comments)   Severe cramping    Hydrocodone Other (See Comments)   Severe back and heart rate    Ivp Dye [iodinated Diagnostic Agents] Hives   Prednisone Other (See Comments)   Shaky, "wasn't feeling herself"   Simvastatin Other (See Comments)   Severe cramping    Stadol [butorphanol]    Vitamin D Analogs Other (See Comments)   Severe pain from head to toe    Xarelto [rivaroxaban] Other (See Comments)   Severe bleeding    Nsaids Hives      Medication List    STOP taking these medications   amLODipine 10 MG tablet Commonly known as:  NORVASC     TAKE these medications   acetaminophen 500 MG tablet Commonly known as:  TYLENOL Take 500 mg by mouth every 6 (six) hours as needed for mild pain, moderate pain, fever or headache.   apixaban 5 MG Tabs tablet Commonly known as:  ELIQUIS Take 1 tablet (5 mg total) by mouth 2 (two) times daily.   atenolol 25 MG tablet Commonly known as:  TENORMIN TAKE ONE TABLET BY MOUTH DAILY   diltiazem 120 MG tablet Commonly known as:  CARDIZEM Take 1 tablet (120 mg total) by mouth 2 (two) times daily as needed (for fast heart rate).   gabapentin 100 MG capsule Commonly known as:  NEURONTIN Take 1 capsule (100 mg total) by mouth at bedtime. What changed:  medication strength  how much to take  when to take this   HYDROmorphone 2 MG tablet Commonly known as:  DILAUDID Take 1 tablet (2 mg total) by mouth every 6 (six) hours as needed for severe pain. What changed:  reasons to take this   pravastatin 20 MG tablet Commonly known as:  PRAVACHOL Take 1 tablet (20 mg total) by mouth every evening.      Discharge Instructions: Please refer to Patient Instructions section of EMR for full details.  Patient was counseled important signs and symptoms that should prompt return to medical care, changes in medications, dietary instructions, activity restrictions, and follow up  appointments.   Follow-Up Appointments: Follow-up Information    Marvel PlanXu, Jindong, MD. Schedule an appointment as soon as possible for a visit in 6 week(s).   Specialty:  Neurology Contact information: [address] [place] [phone]        Campbell StallMayo, Katy Dodd, MD. Go on 04/16/2017.   Specialty:  Family Medicine Why:  Appointment at 8:45 AM.  Please arrive by 8:30.  Contact information: [address] [place] [address] [phone]          Freddrick MarchAmin, Lekisha Mcghee, MD 04/09/2017, 9:29 PM PGY-1, [facility]

## 2017-04-12 ENCOUNTER — Ambulatory Visit (HOSPITAL_COMMUNITY): Payer: Self-pay

## 2017-04-16 ENCOUNTER — Ambulatory Visit (INDEPENDENT_AMBULATORY_CARE_PROVIDER_SITE_OTHER): Payer: Self-pay | Admitting: Internal Medicine

## 2017-04-16 ENCOUNTER — Encounter: Payer: Self-pay | Admitting: Internal Medicine

## 2017-04-16 DIAGNOSIS — R200 Anesthesia of skin: Secondary | ICD-10-CM

## 2017-04-16 DIAGNOSIS — I10 Essential (primary) hypertension: Secondary | ICD-10-CM

## 2017-04-16 DIAGNOSIS — G458 Other transient cerebral ischemic attacks and related syndromes: Secondary | ICD-10-CM

## 2017-04-16 NOTE — Patient Instructions (Signed)
It was so nice to see you!  Let's continue your medications as they are right now.  Please follow-up in 4 weeks. At that time, we will recheck your blood pressure and thyroid level. We can also check in to see how the gabapentin is working for you.  I would also recommend that you work on renewing the orange card. You are overdue for a mammogram, colonoscopy, dentist appointment, etc.  -Dr. Nancy MarusMayo

## 2017-04-17 NOTE — Progress Notes (Signed)
   [facility] Phone: [phone]  Subjective:  Kristi Duncan is a 56 year old female presenting to clinic for hospital follow-up.  TIA: Pt admitted to the hospital with right sided facial numbness and drooling. Stroke work-up was negative and it was thought that patient likely had a TIA. Has a history of A-fib and has seen Cardiology for this, but was not placed on anti-coagulation because she has been on Xarelto in the past and had bleeding from her gums and GI tract. Started on Pravastatin and Eliquis while hospitalized. Has not had any bleeding. States her facial numbness and drooling completely resolved while she was in the hospital and have not returned. No arm/leg weakness. No difficulty with walking or balancing. No muscle pain since starting the statin.  Right Arm Pain/Numbness: Has been going on for over a month. The pain comes and goes. Describes the pain as "achy". Pain shoots down the middle of her arm. Was seen for this on 5/18. Was thought to be secondary to cervical radiculopathy. Was scheduled for MRI c-spine, but did not have this done before she presented to the hospital. 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 Was initially prescribed Gabapentin 300mg  tid, but felt like this made her feel groggy and "out of it". She is now taking 300mg  at night, which is helping her pain but not causing her to be so groggy.  HTN: Checking BP at home daily. Have been in the 120s-130s systolic. Taking Atenolol 25mg  daily. Was previously taking Norvasc, but this was stopped in the hospital for low blood pressures. No chest pain, no shortness of breath, no lower extremity edema.  ROS: See HPI for pertinent positives and negatives  Past Medical History- HTN, chronic A-fib, hypothyroidism, HLD  Family history reviewed for today's visit. No changes.  Social history- patient is a  former smoker. Quit in 1993.  Objective: BP 130/84   Pulse 64   Temp 97.9 F (36.6 C) (Oral)   Ht 5\' 8"  (1.727 m)   Wt 240 lb (108.9 kg)   SpO2 98%   BMI 36.49 kg/m  Gen: NAD, alert, cooperative with exam HEENT: NCAT, EOMI, MMM Neck: FROM, supple CV: RRR, no murmur Resp: CTABL, no wheezes, normal work of breathing GI: SNTND, BS present, no guarding or organomegaly Msk: No edema, warm, normal tone, moves UE/LE spontaneously; right shoulder without any erythema or edema. Mildly decreased ROM secondary to pain. No tenderness to palpation of the shoulder. Neuro: Alert and oriented, no gross deficits, +Spurling's test on the right  Assessment/Plan: TIA: Resolved. Tolerating Eliquis and Pravastatin without any issues. - Continue current management - Has Neurology follow-up scheduled 05/26/17  Numbness of Right Arm: Given her radicular symptoms and positive Spurling's test, think this is likely cervical radiculopathy. MRI with foraminal narrowing worse on the right. - Gabapentin 300mg  qhs - Follow-up in 4 weeks. May need to consider increasing Gabapentin dose, adding on an additional med such as Amitriptyline/Nortriptyline, or referral to spine specialist.  HTN: BP 130/84 in clinic today. - Continue Atenolol 25mg  daily - Hold off on restarting Norvasc for now - Follow-up in 4 weeks for blood pressure check   Willadean CarolKaty Lyrah Bradt, MD PGY-2

## 2017-04-18 ENCOUNTER — Ambulatory Visit: Payer: Self-pay

## 2017-04-18 DIAGNOSIS — G459 Transient cerebral ischemic attack, unspecified: Secondary | ICD-10-CM | POA: Insufficient documentation

## 2017-04-18 NOTE — Assessment & Plan Note (Signed)
BP 130/84 in clinic today. - Continue Atenolol 25mg  daily - Hold off on restarting Norvasc for now - Follow-up in 4 weeks for blood pressure check

## 2017-04-18 NOTE — Assessment & Plan Note (Signed)
Resolved. Tolerating Eliquis and Pravastatin without any issues. - Continue current management - Has Neurology follow-up scheduled 05/26/17

## 2017-04-18 NOTE — Assessment & Plan Note (Signed)
Given her radicular symptoms and positive Spurling's test, think this is likely cervical radiculopathy. MRI with foraminal narrowing worse on the right. - Gabapentin 300mg  qhs - Follow-up in 4 weeks. May need to consider increasing Gabapentin dose, adding on an additional med such as Amitriptyline/Nortriptyline, or referral to spine specialist.

## 2017-05-05 ENCOUNTER — Other Ambulatory Visit: Payer: Self-pay | Admitting: Family Medicine

## 2017-05-12 ENCOUNTER — Encounter: Payer: Self-pay | Admitting: Internal Medicine

## 2017-05-12 ENCOUNTER — Ambulatory Visit (INDEPENDENT_AMBULATORY_CARE_PROVIDER_SITE_OTHER): Payer: Self-pay | Admitting: Internal Medicine

## 2017-05-12 VITALS — BP 128/80 | HR 51 | Temp 98.2°F | Ht 68.0 in | Wt 242.8 lb

## 2017-05-12 DIAGNOSIS — M5441 Lumbago with sciatica, right side: Secondary | ICD-10-CM

## 2017-05-12 DIAGNOSIS — G8929 Other chronic pain: Secondary | ICD-10-CM

## 2017-05-12 DIAGNOSIS — M79601 Pain in right arm: Secondary | ICD-10-CM

## 2017-05-12 DIAGNOSIS — Z0000 Encounter for general adult medical examination without abnormal findings: Secondary | ICD-10-CM

## 2017-05-12 DIAGNOSIS — M5442 Lumbago with sciatica, left side: Secondary | ICD-10-CM

## 2017-05-12 DIAGNOSIS — E039 Hypothyroidism, unspecified: Secondary | ICD-10-CM

## 2017-05-12 DIAGNOSIS — Z1159 Encounter for screening for other viral diseases: Secondary | ICD-10-CM

## 2017-05-12 DIAGNOSIS — I10 Essential (primary) hypertension: Secondary | ICD-10-CM

## 2017-05-12 MED ORDER — HYDROMORPHONE HCL 2 MG PO TABS: 2.0000 mg | ORAL_TABLET | Freq: Four times a day (QID) | ORAL | 0 refills | Status: DC | PRN

## 2017-05-12 MED ORDER — DILTIAZEM HCL 120 MG PO TABS
120.0000 mg | ORAL_TABLET | Freq: Two times a day (BID) | ORAL | 0 refills | Status: DC | PRN
Start: 2017-05-12 — End: 2019-03-10

## 2017-05-12 NOTE — Assessment & Plan Note (Signed)
Greatly improved after starting Gabapentin. - Continue Gabapentin 100mg  qhs. Discussed with patient that she can take 2 tablets at night prn if she has nights where the pain is worse. - If pain returns, could consider increasing Gabapentin dose or adding additional agent like Amitriptyline - Patient has follow-up appointment with Neurology on 7/9 so will see if they have any additional recommendations.

## 2017-05-12 NOTE — Assessment & Plan Note (Signed)
-   Check Hep C screening lab, since already getting blood work today - Patient given information to have mammogram done - Discussed colonoscopy- patient will think about this and let me know

## 2017-05-12 NOTE — Assessment & Plan Note (Signed)
Has had this for years and has used Dilaudid for many years. Only taking ~2-3 tablets per year, when she absolutely needs it. Her current Dilaudid tablets have expired. - Refill for Dilaudid #5

## 2017-05-12 NOTE — Progress Notes (Signed)
[facility] Phone: [phone]  Subjective:  Kristi Duncan is a 56 year old female presenting to clinic for follow-up of HTN, hypothyroidism, chronic back pain, and right arm pain.  HTN: Previously taking Atenolol and Norvasc. Had some low BPs in the hospital so Norvasc was stopped but Atenolol was continued (also has paroxysmal A-fib). Checking BPs at home a few times per week. Systolics mostly in the 120s-130s. No lows. Highest systolic BP she has seen is 160, but that was after going up a flight of stairs. No chest pain, no shortness of breath, no lower extremity edema.  Hypothyroidism: Had previously been on Synthroid 75mcg daily, but stopped taking it after running out of her prescription. TSH was within the normal range and patient was asymptomatic after stopping the Synthroid, so it was not restarted. TSH in the hospital 04/06/17 was 4.867, but free T4 was normal. Pt denies any palpitations, constipation, dry skin, heat/cold intolerance.  Right Arm Pain: Has had achy pain that shoots down her right arm for the last few months. MRI c-spine with multilevel spondylosis and foraminal worse on the right. Was prescribed Gabapentin for presumed nerve pain at last visit. Gabapentin has been helping a lot. Pain has almost completely resolved. She rates the pain as a 1/10 and states "she can feel that it's there, but it doesn't really hurt". Also using heating pad, which helps.  Chronic Back Pain: Has been going on for many, many years. States the only thing that has worked for her is PO Dilaudid. She has taken this for years, but only takes it when she absolutely needs it. She takes Dilaudid maybe 2-3 times per year. She states her medication has expired and she would like a few tablets to keep on hand when absolutely necessary. No bowel/bladder incontinence, no pain that wakes her up at night, no saddle anesthesia.  ROS: See HPI for pertinent positives and negatives  Past Medical  History- hypertension, paroxysmal atrial fibrillation, recent TIA, hypothyroidism, hyperlipidemia  Family history reviewed for today's visit. No changes.  Social history- patient is a former smoker  Objective: BP 128/80 (BP Location: Left Arm, Patient Position: Sitting, Cuff Size: Large)   Pulse (!) 51   Temp 98.2 F (36.8 C) (Oral)   Ht 5\' 8"  (1.727 m)   Wt 242 lb 12.8 oz (110.1 kg)   SpO2 98%   BMI 36.92 kg/m  Gen: NAD, alert, cooperative with exam HEENT: NCAT, EOMI, MMM Neck: FROM, supple, no thyromegaly or masses CV: RRR, no murmur Resp: CTABL, no wheezes, normal work of breathing GI: SNTND, BS present, no guarding or organomegaly Msk: Right arm without any erythema, edema, or gross deformity; normal ROM. Lower back with normal ROM, no midline tenderness. Neuro: Alert and oriented, no gross deficits, Spurling's test negative on the right. 5/5 muscle strength throughout. Normal gait. Skin: No rashes, no lesions Psych: Appropriate behavior  Assessment/Plan: HTN: Previously taking Atenolol and Norvasc, but Norvasc stopped in the hospital due to low BPs. BP 128/80 today. - Continue Atenolol 25mg  daily - Will not restart Norvasc at this time - Continue checking BPs at home daily. Pt will let me know if she starts having persistently high BPs - Follow-up in 3 months  Hypothyroidism: Previously on Synthroid 75mcg daily, but stopped taking it and repeat TSH was normal. Pt asymptomatic. - Recheck TSH today - Follow-up in 3 months  Right Arm Pain: Greatly improved after starting Gabapentin. - Continue Gabapentin 100mg  qhs. Discussed with patient that  she can take 2 tablets at night prn if she has nights where the pain is worse. - If pain returns, could consider increasing Gabapentin dose or adding additional agent like Amitriptyline - Patient has follow-up appointment with Neurology on 7/9 so will see if they have any additional recommendations.  Chronic Back Pain: Has had  this for years and has used Dilaudid for many years. Only taking ~2-3 tablets per year, when she absolutely needs it. Her current Dilaudid tablets have expired. - Refill for Dilaudid #5  Health Care Maintenance: - Check Hep C screening lab, since already getting blood work today - Patient given information to have mammogram done - Discussed colonoscopy- patient will think about this and let me know   Willadean Carol, MD PGY-2

## 2017-05-12 NOTE — Assessment & Plan Note (Signed)
Previously on Synthroid daily, but stopped taking it and repeat TSH was normal. Pt asymptomatic. - Recheck TSH today - Follow-up in 3 months

## 2017-05-12 NOTE — Patient Instructions (Signed)
I am so happy to hear that you are doing better!  I have ordered a thyroid lab and hepatitis C lab for you today. I will call you with these results.  Your blood pressure looks great today. We do not need to add another medication at this time.  Please call to get your mammogram scheduled at your earliest convenience.  We will see you back in 3 months!  -Dr. Nancy MarusMayo

## 2017-05-12 NOTE — Assessment & Plan Note (Signed)
Previously taking Atenolol and Norvasc, but Norvasc stopped in the hospital due to low BPs. BP 128/80 today. - Continue Atenolol 25mg  daily - Will not restart Norvasc at this time - Continue checking BPs at home daily. Pt will let me know if she starts having persistently high BPs - Follow-up in 3 months

## 2017-05-13 LAB — HEPATITIS C ANTIBODY: Hep C Virus Ab: <0.1 s/co ratio (ref 0.0–0.9)

## 2017-05-13 LAB — TSH: TSH: 2.05 u[IU]/mL (ref 0.450–4.500)

## 2017-05-14 ENCOUNTER — Telehealth: Payer: Self-pay | Admitting: *Deleted

## 2017-05-14 NOTE — Telephone Encounter (Signed)
Patient is aware of lab results. Jazmin Hartsell,CMA

## 2017-05-26 ENCOUNTER — Ambulatory Visit (INDEPENDENT_AMBULATORY_CARE_PROVIDER_SITE_OTHER): Payer: Self-pay | Admitting: Neurology

## 2017-05-26 ENCOUNTER — Encounter: Payer: Self-pay | Admitting: Neurology

## 2017-05-26 VITALS — BP 169/76 | HR 54 | Ht 67.0 in | Wt 244.0 lb

## 2017-05-26 DIAGNOSIS — G458 Other transient cerebral ischemic attacks and related syndromes: Secondary | ICD-10-CM

## 2017-05-26 DIAGNOSIS — M5412 Radiculopathy, cervical region: Secondary | ICD-10-CM

## 2017-05-26 DIAGNOSIS — M79601 Pain in right arm: Secondary | ICD-10-CM

## 2017-05-26 MED ORDER — GABAPENTIN 100 MG PO CAPS: 100.0000 mg | ORAL_CAPSULE | Freq: Every day | ORAL | 11 refills | Status: DC

## 2017-05-26 NOTE — Progress Notes (Signed)
[facility]    Provider:  Dr Lucia Gaskins Referring Provider: Mayo, Allyn Kenner, MD Primary Care Physician:  Campbell Stall, MD  CC:  Follow up stroke  HPI:  Kristi Duncan is a 56 y.o. female here as a referral from Dr. Nancy Marus for follow up on stroke. Patient presented to the emergency room on 04/05/2017 with right-sided facial numbness and drooling in the setting of starting a new medication, Neurontin. The numbness also involved the right arm and right leg. No speech or vision problems. Patient was taking the Neurontin for right arm radicular symptoms which have since improved and she is not interested in following up with neurosurgery or other interventions at this time. Patient has a history of untreated A. fib and it was unclear if symptoms were TIA versus medication effects. Decision was made to start her at anticoagulation. She is on the Eliquis. She has numbness, tingling and pain in the right arm. She has weakness. It is improved. Starts at the neck and radiates down. She is not interested in steroid injections or surgery at this time,will start physical therapy. Most of the pain is proximal. Her arm feels like it is dead weight. Her hand tingles. No more tingling in the face. No more drooling. She has chronic low back pain and sciatica. No other focal neurologic deficits, associated symptoms, inciting events or modifiable factors.   Reviewed notes, labs and imaging from outside physicians, which showed:  Personally reviewed images of the brain which showed a normal CT of the head and normal MRI of the brain without acute findings. 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 Reviewed these images with the patient and discussed stenosis. Patient is not interested in following up with neurosurgery at this time. TTE did not show thrombus or PFO. LDL 125, hemoglobin A1c normal.  Review of  Systems: Patient complains of symptoms per HPI as well as the following symptoms: joint pain, aching muscles. Pertinent negatives and positives per HPI. All others negative.   Social History   Social History  . Marital status: Divorced    Spouse name: N/A  . Number of children: 4  . Years of education: 14   Occupational History  . [employer]    Social History Main Topics  . Smoking status: Former Smoker    Types: Cigarettes    Quit date: 11/19/1991  . Smokeless tobacco: Never Used  . Alcohol use Yes     Comment: occasional   . Drug use: No  . Sexual activity: Not on file   Other Topics Concern  . Not on file   Social History Narrative   Lives at home w/ her daughter   Right-handed   Caffeine: 1 cup coffee per day    Family History  Problem Relation Age of Onset  . Heart disease Maternal Grandmother   . Stroke Maternal Grandfather     Past Medical History:  Diagnosis Date  . A-fib (HCC)   . Chronic back pain   . Hyperlipidemia   . Hypertension   . Thyroid disease     Past Surgical History:  Procedure Laterality Date  . ABDOMINAL HYSTERECTOMY  2008  . CHOLECYSTECTOMY  1992    Current Outpatient Prescriptions  Medication Sig Dispense Refill  . acetaminophen (TYLENOL) 500 MG tablet Take 500 mg by mouth every 6 (six) hours as needed for mild pain, moderate pain, fever or headache.    Marland Kitchen  atenolol (TENORMIN) 25 MG tablet TAKE ONE TABLET BY MOUTH DAILY 90 tablet 1  . diltiazem (CARDIZEM) 120 MG tablet Take 1 tablet (120 mg total) by mouth 2 (two) times daily as needed (for fast heart rate). 10 tablet 0  . gabapentin (NEURONTIN) 100 MG capsule Take 1 capsule (100 mg total) by mouth at bedtime. 30 capsule 11  . HYDROmorphone (DILAUDID) 2 MG tablet Take 1 tablet (2 mg total) by mouth every 6 (six) hours as needed for severe pain. 5 tablet 0  . pravastatin (PRAVACHOL) 20 MG tablet Take 1 tablet (20 mg total) by mouth every evening. 30 tablet 3  . apixaban (ELIQUIS) 5 MG  TABS tablet Take 1 tablet (5 mg total) by mouth 2 (two) times daily. 180 tablet 0   No current facility-administered medications for this visit.     Allergies as of 05/26/2017 - Review Complete 05/26/2017  Allergen Reaction Noted  . Iodine Hives 02/20/2016  . Codeine Nausea And Vomiting 02/05/2016  . Hctz [hydrochlorothiazide] Other (See Comments) 02/05/2016  . Hydrocodone Other (See Comments) 02/05/2016  . Ivp dye [iodinated diagnostic agents] Hives 02/20/2016  . Prednisone Other (See Comments) 02/05/2016  . Simvastatin Other (See Comments) 02/05/2016  . Stadol [butorphanol]  02/05/2016  . Vitamin d analogs Other (See Comments) 02/20/2016  . Xarelto [rivaroxaban] Other (See Comments) 02/05/2016  . Nsaids Hives 02/20/2016    Vitals: BP (!) 169/76   Pulse (!) 54   Ht 5\' 7"  (1.702 m)   Wt 244 lb (110.7 kg)   BMI 38.22 kg/m  Last Weight:  Wt Readings from Last 1 Encounters:  05/26/17 244 lb (110.7 kg)   Last Height:   Ht Readings from Last 1 Encounters:  05/26/17 5\' 7"  (1.702 m)   Physical exam: Exam: Gen: NAD, conversant, well nourised, obese, well groomed                     CV: RRR, no MRG. No Carotid Bruits. No peripheral edema, warm, nontender Eyes: Conjunctivae clear without exudates or hemorrhage  Neuro: Detailed Neurologic Exam  Speech:    Speech is normal; fluent and spontaneous with normal comprehension.  Cognition:    The patient is oriented to person, place, and time;     recent and remote memory intact;     language fluent;     normal attention, concentration,     fund of knowledge Cranial Nerves:    The pupils are equal, round, and reactive to light. The fundi are normal and spontaneous venous pulsations are present. Visual fields are full to finger confrontation. Extraocular movements are intact. Trigeminal sensation is intact and the muscles of mastication are normal. The face is symmetric. The palate elevates in the midline. Hearing intact. Voice is  normal. Shoulder shrug is normal. The tongue has normal motion without fasciculations.   Coordination:    Normal finger to nose and heel to shin. Normal rapid alternating movements.   Gait:    Heel-toe and tandem gait are normal.   Motor Observation:    No asymmetry, no atrophy, and no involuntary movements noted. Tone:    Normal muscle tone.    Posture:    Posture is normal. normal erect    Strength: Mild weakness right triceps and deltoid otherwise trength is V/V in the upper and lower limbs.      Sensation: intact to LT     Reflex Exam:  DTR's:    Deep tendon reflexes in the upper and  lower extremities are normal bilaterally.   Toes:    The toes are downgoing bilaterally.   Clonus:    Clonus is absent.        Assessment/Plan:  56 year old patient with a history of thyroid disease, hypertension, hyperlipidemia, chronic back pain, neck pain, atrial fibrillation who presented to the emergency room with right sided drooling, right arm and leg symptoms in the setting of new medication. Unclear etiology whether this was a TIA (MRI of the brain and CT of the head negative for acute abnormalities) or a medication effect. Patient has atrial fibrillation that was not treated and the decision was made to start her on anticoagulation and she is doing well. Patient also has cervical radiculopathy of the right arm but is not interested in neurosurgical for epidural steroid injections at this time. We'll refer to physical therapy. Declines emg/ncs, asked to return to clinic if symptoms worsen. Discussed TIA versus other etiologies, she was started on pravastatin for goal LDL less than 70, normotensive blood pressure goal, discussed diet and exercise, will refer to physical therapy for right arm radiculopathy.  Orders Placed This Encounter  Procedures  . Ambulatory referral to Physical Therapy     .Naomie Dean, MD  [facility] [address] [place], [place] [address]  Phone [phone] Fax [phone]

## 2017-05-26 NOTE — Patient Instructions (Addendum)
Remember to drink plenty of fluid, eat healthy meals and do not skip any meals. Try to eat protein with a every meal and eat a healthy snack such as fruit or nuts in between meals. Try to keep a regular sleep-wake schedule and try to exercise daily, particularly in the form of walking, 20-30 minutes a day, if you can.   As far as your medications are concerned, I would like to suggest: Continue current medications  As far as diagnostic testing: Physical therapy  I would like to see you back as needed, sooner if we need to. Please call us with any interim questions, concerns, problems, updates or refill requests.   Our phone number is [phone]. We also have an after hours call service for urgent matters and there is a physician on-call for urgent questions. For any emergencies you know to call 911 or go to the nearest emergency room   Transient Ischemic Attack A transient ischemic attack (TIA) is a "warning stroke" that causes stroke-like symptoms. A TIA does not cause lasting damage to the brain. The symptoms of a TIA can happen fast and do not last long. It is important to know the symptoms of a TIA and what to do. This can help prevent stroke or death. Follow these instructions at home:  Take medicines only as told by your doctor. Make sure you understand all of the instructions.  You may need to take aspirin or warfarin medicine. Warfarin needs to be taken exactly as told. ? Taking too much or too little warfarin is dangerous. Blood tests must be done as often as told by your doctor. A PT blood test measures how long it takes for blood to clot. Your PT is used to calculate another value called an INR. Your PT and INR help your doctor adjust your warfarin dosage. He or she will make sure you are taking the right amount. ? Food can cause problems with warfarin and affect the results of your blood tests. This is true for foods high in vitamin K. Eat the same amount of foods high in vitamin K  each day. Foods high in vitamin K include spinach, kale, broccoli, cabbage, collard and turnip greens, Brussels sprouts, peas, cauliflower, seaweed, and parsley. Other foods high in vitamin K include beef and pork liver, green tea, and soybean oil. Eat the same amount of foods high in vitamin K each day. Avoid big changes in your diet. Tell your doctor before changing your diet. Talk to a food specialist (dietitian) if you have questions. ? Many medicines can cause problems with warfarin and affect your PT and INR. Tell your doctor about all medicines you take. This includes vitamins and dietary pills (supplements). Do not take or stop taking any prescribed or over-the-counter medicines unless your doctor tells you to. ? Warfarin can cause more bruising or bleeding. Hold pressure over any cuts for longer than normal. Talk to your doctor about other side effects of warfarin. ? Avoid sports or activities that may cause injury or bleeding. ? Be careful when you shave, floss, or use sharp objects. ? Avoid or drink very little alcohol while taking warfarin. Tell your doctor if you change how much alcohol you drink. ? Tell your dentist and other doctors that you take warfarin before any procedures.  Follow your diet program as told, if you are given one.  Keep a healthy weight.  Stay active. Try to get at least 30 minutes of activity on all or most days.  Do not use any tobacco products, including cigarettes, chewing tobacco, or electronic cigarettes. If you need help quitting, ask your doctor.  Limit alcohol intake to no more than 1 drink per day for nonpregnant women and 2 drinks per day for men. One drink equals 12 ounces of beer, 5 ounces of wine, or 1 ounces of hard liquor.  Do not abuse drugs.  Keep your home safe so you do not fall. You can do this by: ? Putting grab bars in the bedroom and bathroom. ? Raising toilet seats. ? Putting a seat in the shower.  Keep all follow-up visits as told  by your doctor. This is important. Contact a doctor if:  Your personality changes.  You have trouble swallowing.  You have double vision.  You are dizzy.  You have a fever. Get help right away if: These symptoms may be an emergency. Do not wait to see if the symptoms will go away. Get medical help right away. Call your local emergency services (911 in the [place] Do not drive yourself to the hospital.  You have sudden weakness or lose feeling (go numb), especially on one side of the body. This can affect your: ? Face. ? Arm. ? Leg.  You have sudden trouble walking.  You have sudden trouble moving your arms or legs.  You have sudden confusion.  You have trouble talking.  You have trouble understanding.  You have sudden trouble seeing in one or both eyes.  You lose your balance.  Your movements are not smooth.  You have a sudden, very bad headache with no known cause.  You have new chest pain.  Your heartbeat is unsteady.  You are partly or totally unaware of what is going on around you.  This information is not intended to replace advice given to you by your health care provider. Make sure you discuss any questions you have with your health care provider. Document Released: 08/13/2008 Document Revised: 07/08/2016 Document Reviewed: 02/09/2014 Elsevier Interactive Patient Education  [employer]2018 [employer]

## 2017-05-27 ENCOUNTER — Other Ambulatory Visit: Payer: Self-pay | Admitting: Internal Medicine

## 2017-05-27 ENCOUNTER — Other Ambulatory Visit: Payer: Self-pay | Admitting: *Deleted

## 2017-05-27 MED ORDER — APIXABAN 5 MG PO TABS: 5.0000 mg | ORAL_TABLET | Freq: Two times a day (BID) | ORAL | 0 refills | Status: DC

## 2017-06-03 NOTE — Telephone Encounter (Signed)
[facility] Eliquis was approved by PCP on 05/27/2017, however, print was selected. Please resend by fax. Kinnie FeilL. Ducatte, RN, BSN

## 2017-06-05 ENCOUNTER — Other Ambulatory Visit: Payer: Self-pay | Admitting: Internal Medicine

## 2017-06-05 MED ORDER — APIXABAN 5 MG PO TABS: 5.0000 mg | ORAL_TABLET | Freq: Two times a day (BID) | ORAL | 0 refills | Status: DC

## 2017-06-05 NOTE — Telephone Encounter (Signed)
Pt states Rx for Eliquis went to the wrong pharmacy, pt needs it to go the [facility] program. ep

## 2017-06-05 NOTE — Telephone Encounter (Signed)
Is there any way we can call this refill into [facility]? Every time I try to fax it, the prescription never gets to them. The prescription is: Eliquis 5mg  PO bid, 180 tablets, 0 refills. Thanks!

## 2017-06-05 NOTE — Telephone Encounter (Signed)
[facility].  Medication resent to [facility] per patient request.  Clovis PuMartin, Tamika L, RN

## 2017-06-05 NOTE — Telephone Encounter (Signed)
LM for [facility] with script information. Jeramine Delis,CMA

## 2017-06-11 ENCOUNTER — Ambulatory Visit (INDEPENDENT_AMBULATORY_CARE_PROVIDER_SITE_OTHER): Payer: Self-pay | Admitting: Internal Medicine

## 2017-06-11 ENCOUNTER — Ambulatory Visit (INDEPENDENT_AMBULATORY_CARE_PROVIDER_SITE_OTHER): Payer: Self-pay | Admitting: Family Medicine

## 2017-06-11 VITALS — BP 156/82

## 2017-06-11 VITALS — Ht 67.0 in | Wt 238.0 lb

## 2017-06-11 DIAGNOSIS — I480 Paroxysmal atrial fibrillation: Secondary | ICD-10-CM

## 2017-06-11 DIAGNOSIS — Z7901 Long term (current) use of anticoagulants: Secondary | ICD-10-CM

## 2017-06-11 DIAGNOSIS — N939 Abnormal uterine and vaginal bleeding, unspecified: Secondary | ICD-10-CM

## 2017-06-11 LAB — POCT HEMOGLOBIN: HEMOGLOBIN: 12 g/dL — AB (ref 12.2–16.2)

## 2017-06-11 NOTE — Patient Instructions (Signed)
Please call your cardiologist's office and schedule an appointment to be seen. I don't think you need an updated referral, but if you do then please call our office and let me know and I'll put that through.  Stop your Eliquis as we discussed. Watch out for worsening bleeding or any signs of dizziness, shortness of breath or chest pain. We discussed what to do if you have any of those.  I'm going to make an appointment to see your regular doctor here early next week. Please call me interim with any new questions or new symptoms. It was great to meet you.

## 2017-06-13 ENCOUNTER — Encounter: Payer: Self-pay | Admitting: Family Medicine

## 2017-06-13 DIAGNOSIS — Z7901 Long term (current) use of anticoagulants: Secondary | ICD-10-CM | POA: Insufficient documentation

## 2017-06-13 NOTE — Assessment & Plan Note (Signed)
Current episode of vaginal bleeding in a patient who is status post  hysterectomy and is currently on anticoagulation, I think she needs to follow up with Cardiology.  I reviewed her EKGs available in the chart and  None of them showed atrial fibrillation so I'm assuming it must be paroxysmal.  This is now her second episode of bleeding on chronic anticoagulation.  I would like cardiology to reevaluate risk-benefit ratio for her.  She has not seen them since winter of 2017 but I don't think she will need new referral.  Told her to call and get an appointment.  If she has any issues or needs a referral, she will let me know.

## 2017-06-13 NOTE — Progress Notes (Signed)
    CHIEF COMPLAINT / HPI:  Vaginal bleeding.  Started 3 or 4 days ago and has been mostly spotting until this morning when she had firly heavy flow for a while.  It has stopped now;.no pelvic pain. Has not had any other unusual bleeding specifically no blood in urine, no bleeding with tooth brush.  She is currently on anticoagulation for atrial fibrillation.   PERTINENT  PMH / PSH: I have reviewed the patient's medications, allergies, past medical and surgical history, smoking status.  Pertinent findings that relate to today's visit / issues include: Currently on anticoagulation with Eliquis. History of abnormal bleeding in the past when she was on xarelto. Status post total hysterectomy History of TIA History of atrial fibrillation.  Review of office notes and EKGs reveals that she has very intermittent episodes maybe once every 2 or 3 months.  She can tell w.  He usually self resolves within 30 minutes. Sickle cell trait Former smoker   REVIEW OF SYSTEMS:  No unusual weight change.  No fever.  Denies chest pain.no change in exercise tolerance.  Denies shortness of breath.  No dizziness.  No lower extremity edema.  No abdominal pain.  No melena.  See HPI above for additional pertinent review of systems.  OBJECTIVE:    Vital signs reviewed. NECK: No lymphadenopathy GENERAL: Well-developed, well-nourished, no acute distress. CARDIOVASCULAR: Regular rate and rhythm no murmur gallop or rub LUNGS: Clear to auscultation bilaterally, no rales or wheeze. ABDOMEN: Soft positive bowel sounds. nontender. NEURO: No gross focal neurological deficits. MSK: Movement of extremity x 4.   POC Hgb 12.0   ASSESSMENT / PLAN: Please see problem oriented charting for details

## 2017-06-13 NOTE — Assessment & Plan Note (Signed)
We'll stop anticoagulation.  Hemoglobin today is 12.  Precautions and red flags for return are given.  I would like her to be seen early next week by her PCP.

## 2017-06-17 ENCOUNTER — Ambulatory Visit (INDEPENDENT_AMBULATORY_CARE_PROVIDER_SITE_OTHER): Payer: Self-pay | Admitting: Internal Medicine

## 2017-06-17 ENCOUNTER — Encounter: Payer: Self-pay | Admitting: Internal Medicine

## 2017-06-17 DIAGNOSIS — N939 Abnormal uterine and vaginal bleeding, unspecified: Secondary | ICD-10-CM

## 2017-06-17 DIAGNOSIS — Z7901 Long term (current) use of anticoagulants: Secondary | ICD-10-CM

## 2017-06-17 LAB — POCT HEMOGLOBIN: Hemoglobin: 11.5 g/dL — AB (ref 12.2–16.2)

## 2017-06-17 NOTE — Patient Instructions (Signed)
It was so nice to see you!  Please follow-up with the cardiologist to discuss the need for anticoagulation in the future.  We will see you back after your visit with him.  -Dr. Nancy MarusMayo

## 2017-06-18 NOTE — Progress Notes (Signed)
   [facility] Phone: [phone]  Subjective:  Kristi SpiesMazie is a 56 year old female presenting to clinic for follow-up of vaginal bleeding.  -Has had 3 episodes of vaginal bleeding that she describes as "bright red blood that covers the toilet tissue when she wipes" -Had total hysterectomy many years ago and has not had any recent sexual intercourse -Has had paroxysmal A-fib since 2008, rate controlled with Atenolol and uses Diltiazem prn (has only had to use Diltiazem once in the last year and only has a few episodes of A-fib per year) -Was on Xarelto a few years ago, but this was stopped due to bloody bowel movements and bleeding gums -Seen by Cardiology (Dr. Elberta Fortisamnitz) 02/22/16 who discussed restarting Xarelto, but she did not want to do this -Hospitalized in 03/2017 for suspected TIA vs medication side effect to Gabapentin, started on Eliquis due to history of paroxysmal A-fib; CT head and MRI brain were negative -Seen by Neurology (Dr. Lucia GaskinsAhern) 05/26/17 for hospital follow-up and was referred to physical therapy -Seen in clinic 7/25 with vaginal bleeding and Eliquis was stopped.  Returns today for follow-up. Has not had any additional vaginal bleeding since she stopped the Eliquis. No rectal bleeding. No bleeding gums. She states she does not know if it would be best to restart anticoagulation or not.   ROS: See HPI for pertinent positives and negatives  Past Medical History- HTN, pA-fib, ?TIA, hypothyroidism, HLD  Family history reviewed for today's visit. No changes.  Social history- patient is a former smoker  Objective: BP (!) 152/74   Pulse 62   Temp 98.4 F (36.9 C) (Oral)   Ht 5\' 7"  (1.702 m)   Wt 239 lb (108.4 kg)   SpO2 99%   BMI 37.43 kg/m  Gen: NAD, alert, cooperative with exam HEENT: NCAT, EOMI, MMM Neck: FROM, supple CV: RRR, no murmur Resp: CTABL, no wheezes, normal work of breathing  Assessment/Plan: Chronic Anticoagulation: Started on Eliquis  at recent hospitalization for concern for TIA (vs med side effect) with a history of paroxysmal A-fib. A-fib has been well-controlled and she is only have a few episodes per year. Unable to tolerate Xarelto in the past due to bloody bowel movements and bleeding gums. Eliquis stopped 06/11/17 after she had 3 episodes of vaginal bleeding (she is s/p total hysterectomy). CHADSVASC score is a 4 if she truly had a TIA, or a 2 if she had a medication side effect. CHADSVASC of 4 correlates to a 4.8% stroke risk per year. HAS-BLED score is a 2, which correlates to a 4.1% risk of a major bleed. - Continue to hold Eliquis for now - POC Hgb checked in clinic today and was stable at 11.5 (from 12.0 on 7/25) - Will send message to Cardiology and Neurology for input - Will see patient back in clinic in the next few weeks to discuss risks/benefits of anticoagulation after talking to the specialists.   Willadean CarolKaty Mayo, MD PGY-3

## 2017-06-18 NOTE — Assessment & Plan Note (Signed)
Started on Eliquis at recent hospitalization for concern for TIA (vs med side effect) with a history of paroxysmal A-fib. A-fib has been well-controlled and she is only have a few episodes per year. Unable to tolerate Xarelto in the past due to bloody bowel movements and bleeding gums. Eliquis stopped 06/11/17 after she had 3 episodes of vaginal bleeding (she is s/p total hysterectomy). CHADSVASC score is a 4 if she truly had a TIA, or a 2 if she had a medication side effect. CHADSVASC of 4 correlates to a 4.8% stroke risk per year. HAS-BLED score is a 2, which correlates to a 4.1% risk of a major bleed. - Continue to hold Eliquis for now - POC Hgb checked in clinic today and was stable at 11.5 (from 12.0 on 7/25) - Will send message to Cardiology and Neurology for input - Will see patient back in clinic in the next few weeks to discuss risks/benefits of anticoagulation after talking to the specialists.

## 2017-06-27 ENCOUNTER — Encounter: Payer: Self-pay | Admitting: *Deleted

## 2017-06-30 NOTE — Progress Notes (Signed)
Opened in error

## 2017-07-15 ENCOUNTER — Ambulatory Visit (INDEPENDENT_AMBULATORY_CARE_PROVIDER_SITE_OTHER): Payer: No Typology Code available for payment source | Admitting: Cardiology

## 2017-07-15 ENCOUNTER — Encounter: Payer: Self-pay | Admitting: Cardiology

## 2017-07-15 VITALS — BP 166/88 | HR 55 | Ht 67.0 in | Wt 237.4 lb

## 2017-07-15 DIAGNOSIS — I10 Essential (primary) hypertension: Secondary | ICD-10-CM

## 2017-07-15 DIAGNOSIS — I480 Paroxysmal atrial fibrillation: Secondary | ICD-10-CM

## 2017-07-15 DIAGNOSIS — E785 Hyperlipidemia, unspecified: Secondary | ICD-10-CM

## 2017-07-15 NOTE — Patient Instructions (Signed)
Medication Instructions:  Your physician recommends that you continue on your current medications as directed. Please refer to the Current Medication list given to you today.  If you need a refill on your cardiac medications before your next appointment, please call your pharmacy.   Labwork: None ordered  Testing/Procedures: None ordered  Follow-Up: Your physician wants you to follow-up in: 6 months with Dr. Elberta Fortis.  You will receive a reminder letter in the mail two months in advance. If you don't receive a letter, please call our office to schedule the follow-up appointment.  Thank you for choosing [facility]!!   Dory Horn, RN [phone]  Any Other Special Instructions Will Be Listed Below (If Applicable).  Please keep track of your blood pressures over the next several weeks.  Please call the office and report those reading.

## 2017-07-15 NOTE — Progress Notes (Signed)
Electrophysiology Office Note   Date:  07/15/2017   ID:  Kristi Duncan, DOB [birth year], MRN [id number]  PCP:  Kristi Stall, MD  Primary Electrophysiologist:  Kristi Schlosser Jorja Loa, MD    Chief Complaint  Patient presents with  . Follow-up    PAF     History of Present Illness: Kristi Duncan is a 56 y.o. female who presents today for electrophysiology evaluation.   She has a history of hypertension, hyperlipidemia, hyperthyroidism, and atrial fibrillation.  She notices palpitations every 2-3 weeks, last minutes to 45 seconds.  She takes daily atenolol.  She has taken Xarelto in the past but did not tolerate it due to gum bleeding.  She says that she gets brief episodes of atrial fibrillation. She takes diltiazem as needed to help slow her rate down. She also takes atenolol which keeps her heart rate slow at baseline.she says that she feels well otherwise, and her most recent episode of atrial fibrillation was months ago. She has been followed in [place] in the past and moved here approximately one year ago and has yet to establish care.  Today, denies symptoms of palpitations, chest pain, shortness of breath, orthopnea, PND, lower extremity edema, claudication, dizziness, presyncope, syncope, bleeding, or neurologic sequela. The patient is tolerating medications without difficulties and is otherwise without complaint today.  She was taking Eliquis, but had both GI and vaginal bleeding. She was told by one of her physicians that she did not take anticoagulation a longer. An episode of atrial fibrillation in January. She took the diltiazem which resolved her symptoms.   Past Medical History:  Diagnosis Date  . A-fib (HCC)   . Chronic back pain   . Hyperlipidemia   . Hypertension   . Thyroid disease    Past Surgical History:  Procedure Laterality Date  . ABDOMINAL HYSTERECTOMY  2008  . CHOLECYSTECTOMY  1992     Current Outpatient Prescriptions  Medication Sig Dispense  Refill  . acetaminophen (TYLENOL) 500 MG tablet Take 500 mg by mouth every 6 (six) hours as needed for mild pain, moderate pain, fever or headache.    Marland Kitchen atenolol (TENORMIN) 25 MG tablet TAKE ONE TABLET BY MOUTH DAILY 90 tablet 1  . diltiazem (CARDIZEM) 120 MG tablet Take 1 tablet (120 mg total) by mouth 2 (two) times daily as needed (for fast heart rate). 10 tablet 0  . gabapentin (NEURONTIN) 100 MG capsule Take 1 capsule (100 mg total) by mouth at bedtime. 30 capsule 11  . HYDROmorphone (DILAUDID) 2 MG tablet Take 1 tablet (2 mg total) by mouth every 6 (six) hours as needed for severe pain. 5 tablet 0  . pravastatin (PRAVACHOL) 20 MG tablet Take 1 tablet (20 mg total) by mouth every evening. 30 tablet 3   No current facility-administered medications for this visit.     Allergies:   Iodine; Codeine; Hctz [hydrochlorothiazide]; Hydrocodone; Ivp dye [iodinated diagnostic agents]; Prednisone; Simvastatin; Stadol [butorphanol]; Vitamin d analogs; Xarelto [rivaroxaban]; and Nsaids   Social History:  The patient  reports that she quit smoking about 25 years ago. Her smoking use included Cigarettes. She has never used smokeless tobacco. She reports that she drinks alcohol. She reports that she does not use drugs.   Family History:  The patient's family history includes Heart disease in her maternal grandmother; Stroke in her maternal grandfather.    ROS:  Please see the history of present illness.   Otherwise, review of systems is positive for palpitations.  All other systems are reviewed and negative.   PHYSICAL EXAM: VS:  BP (!) 166/88   Pulse (!) 55   Ht 5\' 7"  (1.702 m)   Wt 237 lb 6.4 oz (107.7 kg)   SpO2 98%   BMI 37.18 kg/m  , BMI Body mass index is 37.18 kg/m. GEN: Well nourished, well developed, in no acute distress  HEENT: normal  Neck: no JVD, carotid bruits, or masses Cardiac: RRR; no murmurs, rubs, or gallops,no edema  Respiratory:  clear to auscultation bilaterally, normal  work of breathing GI: soft, nontender, nondistended, + BS MS: no deformity or atrophy  Skin: warm and dry Neuro:  Strength and sensation are intact Psych: euthymic mood, full affect  EKG:  EKG is not ordered today. Personal review of the ekg ordered 04/07/17 shows SR, rate 63  Recent Labs: 04/05/2017: ALT 17 04/06/2017: BUN 10; Creatinine, Ser 0.96; Platelets 257; Potassium 3.6; Sodium 140 05/12/2017: TSH 2.050 06/17/2017: Hemoglobin 11.5    Lipid Panel     Component Value Date/Time   CHOL 191 04/06/2017 0603   TRIG 92 04/06/2017 0603   HDL 48 04/06/2017 0603   CHOLHDL 4.0 04/06/2017 0603   VLDL 18 04/06/2017 0603   LDLCALC 125 (H) 04/06/2017 0603     Wt Readings from Last 3 Encounters:  07/15/17 237 lb 6.4 oz (107.7 kg)  06/17/17 239 lb (108.4 kg)  06/11/17 238 lb (108 kg)       ASSESSMENT AND PLAN:  1.  Paroxysmal atrial fibrillation: She had one episode of atrial fibrillation back in January. Took diltiazem which relieved her symptoms. She is currently unable to take anticoagulation due to both GI and vaginal bleeding. We'll continue with current management.  This patients CHA2DS2-VASc Score and unadjusted Ischemic Stroke Rate (% per year) is equal to 2.2 % stroke rate/year from a score of 2  Above score calculated as 1 point each if present [CHF, HTN, DM, Vascular=MI/PAD/Aortic Plaque, Age if 65-74, or Female] Above score calculated as 2 points each if present [Age > 75, or Stroke/TIA/TE]  2. Hypertension: Blood pressure is elevated today in clinic. She Ahsley Attwood return home and check her blood pressure over the next 2 weeks and call us back with results. She was recently taken off of amlodipine.  3. Hyperlipidemia: continue pravastatin  Current medicines are reviewed at length with the patient today.   The patient does not have concerns regarding her medicines.  The following changes were made today:  None  Labs/ tests ordered today include:  No orders of the defined  types were placed in this encounter.    Disposition:   FU with Kristi Duncan 6 months  Signed, Kristi Duncan Jorja Loa, MD  07/15/2017 11:35 AM     [facility] [address] [place] [address] [phone] (office) [phone] (fax)

## 2017-07-22 ENCOUNTER — Telehealth: Payer: Self-pay | Admitting: Cardiology

## 2017-07-22 MED ORDER — AMLODIPINE BESYLATE 5 MG PO TABS: 5.0000 mg | ORAL_TABLET | Freq: Every day | ORAL | 1 refills | Status: DC

## 2017-07-22 NOTE — Telephone Encounter (Signed)
New Message   Patient was told to call and report her BP readings...  Sept 1  196/98  164/81  Sept 3  175/88  162/82  Sept 4  166/80 157/84

## 2017-07-22 NOTE — Telephone Encounter (Signed)
Advised to restart Amlodipine at 5 mg daily and update office if this does not make improvement in BP.  Will forward note to neurology to ensure this is ok to restart and for clarification of where they want her BPs to average/range. Pt understands I will forward to them.  Patient verbalized understanding and agreeable to plan.

## 2017-07-31 ENCOUNTER — Telehealth: Payer: Self-pay

## 2017-07-31 NOTE — Telephone Encounter (Signed)
RN Sherri from Dr. Elberta Fortisamnitz office called asking about blood pressure parameters for pt who has hx of stroke. She was restarted on amlodipine last week and cardiologist office wants to assure that BP med does not result in hypotension. Sherri said that she was going to call pt to see how she's been doing so far. Discussed w/ Dr. Lucia GaskinsAhern and advised that med be held for BP less than 100/50. Understanding verbalized.

## 2017-07-31 NOTE — Telephone Encounter (Signed)
Called [facility] today, corresponded w/ Victorino DikeJennifer (RN for Dr. Lucia GaskinsAhern -- advised to inform pt to hold Norvasc if SBP < 100 and/or DBP < 50. Called pt and informed her.   Pt report pressures better since Norvasc restart. Today: 154/74, after climbing stairs Yesterday: 139/75 Tuesday: 124/66 Advised to call office back if SBP remains consistently above 130s. Patient verbalized understanding and agreeable to plan.

## 2017-08-13 ENCOUNTER — Other Ambulatory Visit: Payer: Self-pay | Admitting: Internal Medicine

## 2017-10-13 ENCOUNTER — Other Ambulatory Visit: Payer: Self-pay | Admitting: Internal Medicine

## 2017-11-04 ENCOUNTER — Telehealth: Payer: Self-pay | Admitting: *Deleted

## 2017-11-04 ENCOUNTER — Ambulatory Visit (INDEPENDENT_AMBULATORY_CARE_PROVIDER_SITE_OTHER): Payer: No Typology Code available for payment source | Admitting: *Deleted

## 2017-11-04 DIAGNOSIS — Z23 Encounter for immunization: Secondary | ICD-10-CM

## 2017-11-04 NOTE — Telephone Encounter (Signed)
Patient wanted PCP to know she stopped pravastatin 2 mos ago 2/2 severe abd pain and leg pain. Felt better after being off X 1 week. Have added this to allergy list and d/c off med list. [facility]. Kinnie FeilL. Ducatte, RN, BSN

## 2017-11-05 NOTE — Telephone Encounter (Signed)
Noted! Thank you

## 2017-11-07 ENCOUNTER — Other Ambulatory Visit: Payer: Self-pay | Admitting: Internal Medicine

## 2017-11-07 DIAGNOSIS — Z1231 Encounter for screening mammogram for malignant neoplasm of breast: Secondary | ICD-10-CM

## 2017-11-12 ENCOUNTER — Encounter: Payer: Self-pay | Admitting: Internal Medicine

## 2017-11-12 ENCOUNTER — Other Ambulatory Visit: Payer: Self-pay

## 2017-11-12 ENCOUNTER — Ambulatory Visit (INDEPENDENT_AMBULATORY_CARE_PROVIDER_SITE_OTHER): Payer: Self-pay | Admitting: Internal Medicine

## 2017-11-12 ENCOUNTER — Ambulatory Visit: Payer: No Typology Code available for payment source

## 2017-11-12 DIAGNOSIS — I10 Essential (primary) hypertension: Secondary | ICD-10-CM

## 2017-11-12 DIAGNOSIS — E785 Hyperlipidemia, unspecified: Secondary | ICD-10-CM

## 2017-11-12 NOTE — Patient Instructions (Signed)
It was so nice to see you today!  You can start taking any brand of Fish Oil 1000mg  daily. Please also make sure that you are exercising regularly and eating foods that are low in fat. We will recheck your cholesterol panel in the next couple of months to see if it changes at all.  We will see you back around May 2018!  -Dr. Nancy MarusMayo

## 2017-11-14 NOTE — Assessment & Plan Note (Signed)
Well-controlled. BP 138/70. Goal <140/90. - Continue Norvasc 5mg  daily - Follow-up in 4-5 months

## 2017-11-14 NOTE — Assessment & Plan Note (Signed)
Last lipid panel with chol 191, LDL 125, HDL 48. TG 92. ASCVD risk score is 7.9%. Unable to tolerate Pravastatin, Lipitor, or Crestor due to side effects. - Discussed patient's ASCVD risk score in detail. Recommended cholesterol-lowering medication, as patient has a history of stroke. Advised that we could try Zetia. Patient wants to try fish oil and dietary changes for a few months to see if this helps her lipid panel. Will re-check lipid panel in 03/2018. If no improvement with lifestyle modifications, will likely start Zetia at that time.

## 2017-11-14 NOTE — Progress Notes (Signed)
   [facility] Phone: [phone]  Subjective:  Kristi Duncan is a 56 year old female presenting to clinic for follow-up of her HTN and HLD.  HTN: Checking blood pressures occasionally at home. Have been in the 120s-130s systolic. Taking Norvasc 5mg  daily. No side effects. No chest pain, no shortness of breath, no lower extremity edema.  HLD: Previously taking Pravastatin, but stopped this due to leg cramps and abdominal pain. The cramps resolved once she stopped the Pravastatin. Has also been on Crestor and Lipitor in the past, but had the similar issues with those medications. Has been watching what she eats and she is hoping that this makes a difference in her cholesterol level. Last lipid panel obtained 03/2017.  ROS: See HPI for pertinent positives and negatives  Past Medical History- HTN, paroxysmal A-fib, hx TIA, hypothyroidism, HLD, sickle cell trait  Family history reviewed for today's visit. No changes.  Social history- patient is a former smoker, quit in 1993  Objective: BP 138/70   Pulse 70   Temp 98.1 F (36.7 C) (Oral)   Wt 236 lb (107 kg)   SpO2 98%   BMI 36.96 kg/m  Gen: NAD, alert, cooperative with exam HEENT: NCAT, EOMI, MMM Neck: FROM, supple CV: RRR, no murmur Resp: CTABL, no wheezes, normal work of breathing Msk: No edema, warm, normal tone, moves UE/LE spontaneously Neuro: Alert and oriented, no gross deficits Skin: No rashes, no lesions Psych: Appropriate behavior  Assessment/Plan: HTN: Well-controlled. BP 138/70. Goal <140/90. - Continue Norvasc 5mg  daily - Follow-up in 4-5 months  HLD: Last lipid panel with chol 191, LDL 125, HDL 48. TG 92. ASCVD risk score is 7.9%. Unable to tolerate Pravastatin, Lipitor, or Crestor due to side effects. - Discussed patient's ASCVD risk score in detail. Recommended cholesterol-lowering medication, as patient has a history of stroke. Advised that we could try Zetia. Patient wants to try fish oil  and dietary changes for a few months to see if this helps her lipid panel. Will re-check lipid panel in 03/2018. If no improvement with lifestyle modifications, will likely start Zetia at that time.   Willadean CarolKaty Mayo, MD PGY-3

## 2017-11-19 ENCOUNTER — Ambulatory Visit: Payer: Self-pay

## 2017-12-08 ENCOUNTER — Ambulatory Visit
Admission: RE | Admit: 2017-12-08 | Discharge: 2017-12-08 | Disposition: A | Discharge status: Home / Self Care | Payer: No Typology Code available for payment source | Source: Ambulatory Visit | Attending: Family Medicine | Admitting: Family Medicine

## 2017-12-08 DIAGNOSIS — Z1231 Encounter for screening mammogram for malignant neoplasm of breast: Secondary | ICD-10-CM

## 2017-12-10 ENCOUNTER — Other Ambulatory Visit (HOSPITAL_COMMUNITY): Payer: Self-pay | Admitting: *Deleted

## 2017-12-10 DIAGNOSIS — R928 Other abnormal and inconclusive findings on diagnostic imaging of breast: Secondary | ICD-10-CM

## 2018-01-01 ENCOUNTER — Other Ambulatory Visit (HOSPITAL_COMMUNITY): Payer: Self-pay | Admitting: Obstetrics and Gynecology

## 2018-01-01 ENCOUNTER — Ambulatory Visit
Admission: RE | Admit: 2018-01-01 | Discharge: 2018-01-01 | Disposition: A | Discharge status: Home / Self Care | Payer: No Typology Code available for payment source | Source: Ambulatory Visit | Attending: Obstetrics and Gynecology | Admitting: Obstetrics and Gynecology

## 2018-01-01 ENCOUNTER — Encounter (HOSPITAL_COMMUNITY): Payer: Self-pay

## 2018-01-01 ENCOUNTER — Ambulatory Visit (HOSPITAL_COMMUNITY)
Admission: RE | Admit: 2018-01-01 | Discharge: 2018-01-01 | Disposition: A | Discharge status: Home / Self Care | Payer: Self-pay | Source: Ambulatory Visit | Attending: Obstetrics and Gynecology | Admitting: Obstetrics and Gynecology

## 2018-01-01 VITALS — BP 160/90 | Ht 67.0 in | Wt 240.6 lb

## 2018-01-01 DIAGNOSIS — R928 Other abnormal and inconclusive findings on diagnostic imaging of breast: Secondary | ICD-10-CM

## 2018-01-01 DIAGNOSIS — N6310 Unspecified lump in the right breast, unspecified quadrant: Secondary | ICD-10-CM

## 2018-01-01 DIAGNOSIS — Z1239 Encounter for other screening for malignant neoplasm of breast: Secondary | ICD-10-CM

## 2018-01-01 NOTE — Progress Notes (Signed)
[facility] due to recommending additional imaging of the right breast. Screening mammogram completed 12/08/2017. Patient complained of a right breast pinching pain that comes and goes for the past month. Patient rates the pain at a 4-5 out of 10.  Pap Smear: Pap smear not completed today. Last Pap smear was around 2008-2009 prior to hysterectomy and normal per patient. Per patient has no history of an abnormal Pap smear. Patient has a history of a hysterectomy in 2008 or 2009 due to fibroids. Patient no longer needs Pap smears due to her history of a hysterectomy for benign reasons per [facility] and ACOG guidelines. No Pap smear results are in Epic.  Physical exam: Breasts Breasts symmetrical. No skin abnormalities bilateral breasts. No nipple retraction bilateral breasts. No nipple discharge bilateral breasts. No lymphadenopathy. No lumps palpated bilateral breasts. No complaints of pain or tenderness on exam. [facility] for a right breast diagnostic mammogram and possible breast ultrasound per recommendation. Appointment scheduled for Thursday, January 01, 2018 at 0850.        Pelvic/Bimanual No Pap smear completed today since patient has a history of a hysterectomy for benign reasons. Pap smear not indicated per [facility] guidelines.   Smoking History: Patient is a former smoker that quit over 30 years ago.  Patient Navigation: Patient education provided. Access to services provided for patient through [facility] program.   Colorectal Cancer Screening: Per patient has never had a colonoscopy completed. No complaints today. FIT Test given to patient to complete and return to [facility].

## 2018-01-01 NOTE — Patient Instructions (Signed)
Explained breast self awareness with Kristi Duncan. Patient did not need a Pap smear today due to her history of a hysterectomy for benign reasons. Let patient know that she doesn't need any further Pap smears due to her history of a hysterectomy for benign reasons. [facility] for a right breast diagnostic mammogram and possible breast ultrasound per recommendation. Appointment scheduled for Thursday, January 01, 2018 at 0850. Kristi Duncan verbalized understanding.  Evelia Waskey, Kathaleen Maserhristine Poll, RN 8:32 AM

## 2018-01-02 ENCOUNTER — Telehealth: Payer: Self-pay

## 2018-01-02 ENCOUNTER — Encounter (HOSPITAL_COMMUNITY): Payer: Self-pay | Admitting: *Deleted

## 2018-01-02 NOTE — Telephone Encounter (Signed)
Pharmacy reached out to inform that patient has not been taking pravastatin due to leg and stomach pains.

## 2018-01-02 NOTE — Telephone Encounter (Signed)
Thanks! No, we don't need to call her. I'll just note it in her chart.

## 2018-01-05 ENCOUNTER — Other Ambulatory Visit: Payer: Self-pay

## 2018-01-06 ENCOUNTER — Other Ambulatory Visit: Payer: Self-pay | Admitting: Cardiology

## 2018-01-06 MED ORDER — AMLODIPINE BESYLATE 5 MG PO TABS: 5.0000 mg | ORAL_TABLET | Freq: Every day | ORAL | 1 refills | Status: DC

## 2018-01-06 NOTE — Telephone Encounter (Signed)
Pt's Rx was faxed to pt's pharmacy [facility] Fax# [phone]. Confirmation received.

## 2018-01-12 ENCOUNTER — Encounter: Payer: Self-pay | Admitting: Cardiology

## 2018-01-12 ENCOUNTER — Ambulatory Visit (INDEPENDENT_AMBULATORY_CARE_PROVIDER_SITE_OTHER): Payer: No Typology Code available for payment source | Admitting: Cardiology

## 2018-01-12 VITALS — BP 140/80 | HR 54 | Ht 67.0 in | Wt 239.0 lb

## 2018-01-12 DIAGNOSIS — E785 Hyperlipidemia, unspecified: Secondary | ICD-10-CM

## 2018-01-12 DIAGNOSIS — I480 Paroxysmal atrial fibrillation: Secondary | ICD-10-CM

## 2018-01-12 DIAGNOSIS — I10 Essential (primary) hypertension: Secondary | ICD-10-CM

## 2018-01-12 NOTE — Progress Notes (Signed)
Electrophysiology Office Note   Date:  01/12/2018   ID:  Kristi Duncan, DOB Dec 31, 1960, MRN [id number]  PCP:  Campbell StallMayo, Katy Dodd, MD  Primary Electrophysiologist:  Krystl Wickware Jorja LoaMartin Bralynn Donado, MD    Chief Complaint  Patient presents with  . Follow-up    PAF     History of Present Illness: Kristi Duncan is a 57 y.o. female who presents today for electrophysiology evaluation.   She has a history of hypertension, hyperlipidemia, hyperthyroidism, and atrial fibrillation.  She notices palpitations every 2-3 weeks, last minutes to 45 seconds.  She takes daily atenolol.  She has taken Xarelto in the past but did not tolerate it due to gum bleeding.  She says that she gets brief episodes of atrial fibrillation. She takes diltiazem as needed to help slow her rate down. She also takes atenolol which keeps her heart rate slow at baseline.she says that she feels well otherwise, and her most recent episode of atrial fibrillation was months ago. She has been followed in [place] in the past and moved here approximately one year ago and has yet to establish care.  Today, denies symptoms of palpitations, chest pain, shortness of breath, orthopnea, PND, lower extremity edema, claudication, dizziness, presyncope, syncope, bleeding, or neurologic sequela. The patient is tolerating medications without difficulties.  She is currently feeling well.  She has had minimal episodes of atrial fibrillation.  She has not noted any further issues.  She has no major complaints today.   Past Medical History:  Diagnosis Date  . A-fib (HCC)   . Chronic back pain   . Hyperlipidemia   . Hypertension   . Thyroid disease    Past Surgical History:  Procedure Laterality Date  . ABDOMINAL HYSTERECTOMY  2008  . CHOLECYSTECTOMY  1992     Current Outpatient Medications  Medication Sig Dispense Refill  . acetaminophen (TYLENOL) 500 MG tablet Take 500 mg by mouth every 6 (six) hours as needed for mild pain, moderate pain,  fever or headache.    Marland Kitchen. amLODipine (NORVASC) 5 MG tablet Take 1 tablet (5 mg total) by mouth daily. Please keep upcoming appt for future refills. Thank you 90 tablet 1  . atenolol (TENORMIN) 25 MG tablet TAKE 1 TABLET BY MOUTH DAILY 90 tablet 1  . diltiazem (CARDIZEM) 120 MG tablet Take 1 tablet (120 mg total) by mouth 2 (two) times daily as needed (for fast heart rate). 10 tablet 0  . gabapentin (NEURONTIN) 100 MG capsule Take 1 capsule (100 mg total) by mouth at bedtime. 30 capsule 11  . HYDROmorphone (DILAUDID) 2 MG tablet Take 1 tablet (2 mg total) by mouth every 6 (six) hours as needed for severe pain. 5 tablet 0   No current facility-administered medications for this visit.     Allergies:   Eliquis [apixaban]; Iodine; Pravastatin; Codeine; Hctz [hydrochlorothiazide]; Hydrocodone; Ivp dye [iodinated diagnostic agents]; Prednisone; Simvastatin; Stadol [butorphanol]; Vitamin d analogs; Xarelto [rivaroxaban]; and Nsaids   Social History:  The patient  reports that she quit smoking about 26 years ago. Her smoking use included cigarettes. she has never used smokeless tobacco. She reports that she drinks alcohol. She reports that she does not use drugs.   Family History:  The patient's family history includes Heart disease in her maternal grandmother; Stroke in her maternal grandfather.   ROS:  Please see the history of present illness.   Otherwise, review of systems is positive for chest pain, palpitations, snoring, back pain, muscle pain.   All  other systems are reviewed and negative.   PHYSICAL EXAM: VS:  BP 140/80   Pulse (!) 54   Ht 5\' 7"  (1.702 m)   Wt 239 lb (108.4 kg)   BMI 37.43 kg/m  , BMI Body mass index is 37.43 kg/m. GEN: Well nourished, well developed, in no acute distress  HEENT: normal  Neck: no JVD, carotid bruits, or masses Cardiac: RRR; no murmurs, rubs, or gallops,no edema  Respiratory:  clear to auscultation bilaterally, normal work of breathing GI: soft, nontender,  nondistended, + BS MS: no deformity or atrophy  Skin: warm and dry Neuro:  Strength and sensation are intact Psych: euthymic mood, full affect  EKG:  EKG is ordered today. Personal review of the ekg ordered shows his rhythm, rate 54  Recent Labs: 04/05/2017: ALT 17 04/06/2017: BUN 10; Creatinine, Ser 0.96; Platelets 257; Potassium 3.6; Sodium 140 05/12/2017: TSH 2.050 06/17/2017: Hemoglobin 11.5    Lipid Panel     Component Value Date/Time   CHOL 191 04/06/2017 0603   TRIG 92 04/06/2017 0603   HDL 48 04/06/2017 0603   CHOLHDL 4.0 04/06/2017 0603   VLDL 18 04/06/2017 0603   LDLCALC 125 (H) 04/06/2017 0603     Wt Readings from Last 3 Encounters:  01/12/18 239 lb (108.4 kg)  01/01/18 240 lb 9.6 oz (109.1 kg)  11/12/17 236 lb (107 kg)       ASSESSMENT AND PLAN:  1.  Paroxysmal atrial fibrillation: Currently not anticoagulated as she has had significant GI and vaginal bleeding with both Eliquis and Xarelto.  She has had minimal atrial fibrillation.  We Rajah Tagliaferro continue with current management.  This patients CHA2DS2-VASc Score and unadjusted Ischemic Stroke Rate (% per year) is equal to 2.2 % stroke rate/year from a score of 2  Above score calculated as 1 point each if present [CHF, HTN, DM, Vascular=MI/PAD/Aortic Plaque, Age if 65-74, or Female] Above score calculated as 2 points each if present [Age > 75, or Stroke/TIA/TE]   2. Hypertension: Pressure better controlled today in clinic.  She has restarted amlodipine.  No changes.  3. Hyperlipidemia: Continue pravastatin  Current medicines are reviewed at length with the patient today.   The patient does not have concerns regarding her medicines.  The following changes were made today: None  Labs/ tests ordered today include:  Orders Placed This Encounter  Procedures  . EKG 12-Lead     Disposition:   FU with Lesleyanne Politte 12 months  Signed, Toris Laverdiere Jorja Loa, MD  01/12/2018 12:01 PM     [facility] [address] [place] [address] [phone] (office) [phone] (fax)

## 2018-01-12 NOTE — Patient Instructions (Signed)
Medication Instructions:  Your physician recommends that you continue on your current medications as directed. Please refer to the Current Medication list given to you today.  * If you need a refill on your cardiac medications before your next appointment, please call your pharmacy.   Labwork: None ordered  Testing/Procedures: None ordered  Follow-Up: Your physician wants you to follow-up in: 1 year with Dr. Camnitz.  You will receive a reminder letter in the mail two months in advance. If you don't receive a letter, please call our office to schedule the follow-up appointment.  Thank you for choosing [facility]!!   Justise Ehmann, RN [phone]        

## 2018-01-13 LAB — FECAL OCCULT BLOOD, IMMUNOCHEMICAL: FECAL OCCULT BLD: NEGATIVE

## 2018-02-02 ENCOUNTER — Other Ambulatory Visit: Payer: Self-pay | Admitting: *Deleted

## 2018-02-03 MED ORDER — ATENOLOL 25 MG PO TABS: 25.0000 mg | ORAL_TABLET | Freq: Every day | ORAL | 1 refills | Status: DC

## 2018-05-11 ENCOUNTER — Other Ambulatory Visit: Payer: Self-pay

## 2018-05-11 MED ORDER — AMLODIPINE BESYLATE 5 MG PO TABS: 5.0000 mg | ORAL_TABLET | Freq: Every day | ORAL | 2 refills | Status: DC

## 2018-05-11 NOTE — Addendum Note (Signed)
Addended by: Lajoyce CornersBOONE, Kaylise Blakeley R on: 05/11/2018 10:45 AM   Modules accepted: Orders

## 2018-06-10 ENCOUNTER — Ambulatory Visit: Payer: No Typology Code available for payment source

## 2018-06-17 ENCOUNTER — Ambulatory Visit: Payer: No Typology Code available for payment source

## 2018-07-01 ENCOUNTER — Other Ambulatory Visit: Payer: Self-pay

## 2018-07-02 ENCOUNTER — Other Ambulatory Visit (HOSPITAL_COMMUNITY): Payer: Self-pay | Admitting: Obstetrics and Gynecology

## 2018-07-02 ENCOUNTER — Ambulatory Visit
Admission: RE | Admit: 2018-07-02 | Discharge: 2018-07-02 | Disposition: A | Discharge status: Home / Self Care | Payer: No Typology Code available for payment source | Source: Ambulatory Visit | Attending: Obstetrics and Gynecology | Admitting: Obstetrics and Gynecology

## 2018-07-02 DIAGNOSIS — N6310 Unspecified lump in the right breast, unspecified quadrant: Secondary | ICD-10-CM

## 2018-08-03 ENCOUNTER — Other Ambulatory Visit: Payer: Self-pay

## 2018-08-03 MED ORDER — ATENOLOL 25 MG PO TABS: 25.0000 mg | ORAL_TABLET | Freq: Every day | ORAL | 3 refills | Status: DC

## 2018-09-16 ENCOUNTER — Ambulatory Visit (INDEPENDENT_AMBULATORY_CARE_PROVIDER_SITE_OTHER): Payer: Self-pay | Admitting: *Deleted

## 2018-09-16 DIAGNOSIS — Z23 Encounter for immunization: Secondary | ICD-10-CM

## 2019-01-05 ENCOUNTER — Other Ambulatory Visit: Payer: No Typology Code available for payment source

## 2019-02-10 ENCOUNTER — Telehealth: Payer: Self-pay | Admitting: Cardiology

## 2019-02-10 MED ORDER — AMLODIPINE BESYLATE 5 MG PO TABS: 5.0000 mg | ORAL_TABLET | Freq: Every day | ORAL | 0 refills | Status: DC

## 2019-02-10 NOTE — Telephone Encounter (Signed)
Pt's medication was sent to pt's pharmacy as requested. Confirmation received.  °

## 2019-02-10 NOTE — Telephone Encounter (Signed)
New Message         New Message         Patient is returning your call, would like a call back as soon as possible.      *STAT* If patient is at the pharmacy, call can be transferred to refill team.   1. Which medications need to be refilled? (please list name of each medication and dose if known) Amolodipine  2. Which pharmacy/location (including street and city if local pharmacy) is medication to be sent to?[facility] on [address]   3. Do they need a 30 day or 90 day supply? 90

## 2019-02-12 ENCOUNTER — Ambulatory Visit: Payer: No Typology Code available for payment source | Admitting: Cardiology

## 2019-03-10 ENCOUNTER — Telehealth: Payer: Self-pay | Admitting: *Deleted

## 2019-03-10 MED ORDER — AMLODIPINE BESYLATE 5 MG PO TABS: 5.0000 mg | ORAL_TABLET | Freq: Every day | ORAL | 1 refills | Status: DC

## 2019-03-10 MED ORDER — DILTIAZEM HCL 120 MG PO TABS: 120.0000 mg | ORAL_TABLET | Freq: Two times a day (BID) | ORAL | 0 refills | Status: DC | PRN

## 2019-03-10 NOTE — Telephone Encounter (Signed)
Virtual Visit Pre-Appointment Phone Call  "(Name), I am calling you today to discuss your upcoming appointment. We are currently trying to limit exposure to the virus that causes COVID-19 by seeing patients at home rather than in the office."  1. "What is the BEST phone number to call the day of the visit?" - include this in appointment notes  2. "Do you have or have access to (through a family member/friend) a smartphone with video capability that we can use for your visit?" a. If yes - list this number in appt notes as "cell" (if different from BEST phone #) and list the appointment type as a VIDEO visit in appointment notes b. If no - list the appointment type as a PHONE visit in appointment notes  3. Confirm consent - "In the setting of the current Covid19 crisis, you are scheduled for a (phone or video) visit with your provider on (date) at (time).  Just as we do with many in-office visits, in order for you to participate in this visit, we must obtain consent.  If you'd like, I can send this to your mychart (if signed up) or email for you to review.  Otherwise, I can obtain your verbal consent now.  All virtual visits are billed to your insurance company just like a normal visit would be.  By agreeing to a virtual visit, we'd like you to understand that the technology does not allow for your provider to perform an examination, and thus may limit your provider's ability to fully assess your condition. If your provider identifies any concerns that need to be evaluated in person, we will make arrangements to do so.  Finally, though the technology is pretty good, we cannot assure that it will always work on either your or our end, and in the setting of a video visit, we may have to convert it to a phone-only visit.  In either situation, we cannot ensure that we have a secure connection.  Are you willing to proceed?" STAFF: Did the patient verbally acknowledge consent to telehealth visit? Document  YES/NO here: YES  4. Advise patient to be prepared - "Two hours prior to your appointment, go ahead and check your blood pressure, pulse, oxygen saturation, and your weight (if you have the equipment to check those) and write them all down. When your visit starts, your provider will ask you for this information. If you have an Apple Watch or Kardia device, please plan to have heart rate information ready on the day of your appointment. Please have a pen and paper handy nearby the day of the visit as well."  5. Give patient instructions for MyChart download to smartphone OR Doximity/Doxy.me as below if video visit (depending on what platform provider is using)  6. Inform patient they will receive a phone call 15 minutes prior to their appointment time (may be from unknown caller ID) so they should be prepared to answer    TELEPHONE CALL NOTE  Kristi Duncan has been deemed a candidate for a follow-up tele-health visit to limit community exposure during the Covid-19 pandemic. I spoke with the patient via phone to ensure availability of phone/video source, confirm preferred email & phone number, and discuss instructions and expectations.  I reminded Kristi Duncan to be prepared with any vital sign and/or heart rhythm information that could potentially be obtained via home monitoring, at the time of her visit. I reminded Kristi Duncan to expect a phone call prior to  her visit.  Benuel Ly 03/10/2019 4:25 PM   INSTRUCTIONS FOR DOWNLOADING THE MYCHART APP TO SMARTPHONE  - The patient must first make sure to have activated MyChart and know their login information - If Apple, go to [employer] Store and type in MyChart in the search bar and download the app. If Android, ask patient to go to [employer] Play Store and type in [place]pahMyChart in the search bar and download the app. The app is free but as with any other app downloads, their phone may require them to verify saved payment information or Apple/Android  password.  - The patient will need to then log into the app with their MyChart username and password, and select  as their healthcare provider to link the account. When it is time for your visit, go to the MyChart app, find appointments, and click Begin Video Visit. Be sure to Select Allow for your device to access the Microphone and Camera for your visit. You will then be connected, and your provider will be with you shortly.  **If they have any issues connecting, or need assistance please contact MyChart service desk [phone])**  **If using a computer, in order to ensure the best quality for their visit they will need to use either of the following Internet Browsers: [employer] Edge, or Google Chrome**  IF USING DOXIMITY or DOXY.ME - The patient will receive a link just prior to their visit by text.     FULL LENGTH CONSENT FOR TELE-HEALTH VISIT   I hereby voluntarily request, consent and authorize [facility] and its employed or contracted physicians, physician assistants, nurse practitioners or other licensed health care professionals (the Practitioner), to provide me with telemedicine health care services (the "Services") as deemed necessary by the treating Practitioner. I acknowledge and consent to receive the Services by the Practitioner via telemedicine. I understand that the telemedicine visit will involve communicating with the Practitioner through live audiovisual communication technology and the disclosure of certain medical information by electronic transmission. I acknowledge that I have been given the opportunity to request an in-person assessment or other available alternative prior to the telemedicine visit and am voluntarily participating in the telemedicine visit.  I understand that I have the right to withhold or withdraw my consent to the use of telemedicine in the course of my care at any time, without affecting my right to future care or treatment,  and that the Practitioner or I may terminate the telemedicine visit at any time. I understand that I have the right to inspect all information obtained and/or recorded in the course of the telemedicine visit and may receive copies of available information for a reasonable fee.  I understand that some of the potential risks of receiving the Services via telemedicine include:  Marland Kitchen. Delay or interruption in medical evaluation due to technological equipment failure or disruption; . Information transmitted may not be sufficient (e.g. poor resolution of images) to allow for appropriate medical decision making by the Practitioner; and/or  . In rare instances, security protocols could fail, causing a breach of personal health information.  Furthermore, I acknowledge that it is my responsibility to provide information about my medical history, conditions and care that is complete and accurate to the best of my ability. I acknowledge that Practitioner's advice, recommendations, and/or decision may be based on factors not within their control, such as incomplete or inaccurate data provided by me or distortions of diagnostic images or specimens that may result from electronic transmissions. I understand  that the practice of medicine is not an exact science and that Practitioner makes no warranties or guarantees regarding treatment outcomes. I acknowledge that I will receive a copy of this consent concurrently upon execution via email to the email address I last provided but may also request a printed copy by calling the office of [facility].    I understand that my insurance will be billed for this visit.   I have read or had this consent read to me. . I understand the contents of this consent, which adequately explains the benefits and risks of the Services being provided via telemedicine.  . I have been provided ample opportunity to ask questions regarding this consent and the Services and have had my questions  answered to my satisfaction. . I give my informed consent for the services to be provided through the use of telemedicine in my medical care  By participating in this telemedicine visit I agree to the above. Verbal Consent Given

## 2019-03-10 NOTE — Telephone Encounter (Signed)
Concerns and/or Complaints:                    Since your last visit or hospitalization:   1. Have you been having new or worsening chest pain or chest discomfort? No 2. Have you been having new or worsening shortness of breath? No 3. Have you been having new or worsening leg swelling, wt gain, or increase in abdominal girth (pants fitting more tightly)? No 4. Have you had any passing out spells, dizziness, or uncommon headaches? No 5. Have you had any extreme tiredness or fatigue? No 6. Have you had any problems with your device or device site (swelling, fever, tenderness, redness, vibrations, beeping)?No  Recent BP's have been running 130s/70s. Patient is feeling great, has no complaints. RX for Amlodipine and Diltiazem sent to [facility] on [address]

## 2019-03-16 ENCOUNTER — Encounter

## 2019-03-16 ENCOUNTER — Encounter: Payer: Self-pay | Admitting: Cardiology

## 2019-03-16 ENCOUNTER — Telehealth (INDEPENDENT_AMBULATORY_CARE_PROVIDER_SITE_OTHER): Payer: Self-pay | Admitting: Cardiology

## 2019-03-16 ENCOUNTER — Other Ambulatory Visit: Payer: Self-pay

## 2019-03-16 DIAGNOSIS — I10 Essential (primary) hypertension: Secondary | ICD-10-CM

## 2019-03-16 DIAGNOSIS — I480 Paroxysmal atrial fibrillation: Secondary | ICD-10-CM

## 2019-03-16 NOTE — Progress Notes (Signed)
Electrophysiology TeleHealth Note   Due to national recommendations of social distancing due to COVID 19, an audio/video telehealth visit is felt to be most appropriate for this patient at this time.  See Epic message for the patient's consent to telehealth for [name]   Date:  03/16/2019   ID:  Kristi Duncan, DOB 1961-07-13, MRN [id number]  Location: patient's home  Provider location: [address] [place]  Evaluation Performed: Follow-up visit  PCP:  Ellwood Dense, DO  Cardiologist:  Dannie Woolen Jorja Loa, MD  Electrophysiologist:  Dr Elberta Fortis  Chief Complaint:  Atrial fibrillation  History of Present Illness:    Kristi Duncan is a 58 y.o. female who presents via audio/video conferencing for a telehealth visit today.  Since last being seen in our clinic, the patient reports doing very well.  Today, she denies symptoms of palpitations, chest pain, shortness of breath,  lower extremity edema, dizziness, presyncope, or syncope.  The patient is otherwise without complaint today.  The patient denies symptoms of fevers, chills, cough, or new SOB worrisome for COVID 19.  She has a history of hypertension, hypothyroidism, hyperlipidemia, and atrial fibrillation.  Today, denies symptoms of palpitations, chest pain, shortness of breath, orthopnea, PND, lower extremity edema, claudication, dizziness, presyncope, syncope, bleeding, or neurologic sequela. The patient is tolerating medications without difficulties.  Overall she is doing well.  She has minimal atrial fibrillation.  She is PRN diltiazem at home and she has not had to take it very much at all over the last year.  Past Medical History:  Diagnosis Date  . A-fib (HCC)   . Chronic back pain   . Hyperlipidemia   . Hypertension   . Thyroid disease     Past Surgical History:  Procedure Laterality Date  . ABDOMINAL HYSTERECTOMY  2008  . CHOLECYSTECTOMY  1992    Current Outpatient Medications   Medication Sig Dispense Refill  . acetaminophen (TYLENOL) 500 MG tablet Take 500 mg by mouth every 6 (six) hours as needed for mild pain, moderate pain, fever or headache.    Marland Kitchen amLODipine (NORVASC) 5 MG tablet Take 1 tablet (5 mg total) by mouth daily. 90 tablet 1  . atenolol (TENORMIN) 25 MG tablet Take 1 tablet (25 mg total) by mouth daily. 90 tablet 3  . diltiazem (CARDIZEM) 120 MG tablet Take 1 tablet (120 mg total) by mouth 2 (two) times daily as needed (for fast heart rate). 10 tablet 0  . gabapentin (NEURONTIN) 100 MG capsule Take 1 capsule (100 mg total) by mouth at bedtime. 30 capsule 11  . HYDROmorphone (DILAUDID) 2 MG tablet Take 1 tablet (2 mg total) by mouth every 6 (six) hours as needed for severe pain. 5 tablet 0   No current facility-administered medications for this visit.     Allergies:   Eliquis [apixaban]; Iodine; Pravastatin; Codeine; Hctz [hydrochlorothiazide]; Hydrocodone; Ivp dye [iodinated diagnostic agents]; Prednisone; Simvastatin; Stadol [butorphanol]; Vitamin d analogs; Xarelto [rivaroxaban]; and Nsaids   Social History:  The patient  reports that she quit smoking about 27 years ago. Her smoking use included cigarettes. She has never used smokeless tobacco. She reports current alcohol use. She reports that she does not use drugs.   Family History:  The patient's  family history includes Heart disease in her maternal grandmother; Stroke in her maternal grandfather.   ROS:  Please see the history of present illness.   All other systems are personally reviewed and negative.  Exam:    Vital Signs:  There were no vitals taken for this visit.  Well appearing, alert and conversant, regular work of breathing,  good skin color Eyes- anicteric, neuro- grossly intact, skin- no apparent rash or lesions or cyanosis, mouth- oral mucosa is pink   Labs/Other Tests and Data Reviewed:    Recent Labs: No results found for requested labs within last 8760 hours.   Wt  Readings from Last 3 Encounters:  01/12/18 239 lb (108.4 kg)  01/01/18 240 lb 9.6 oz (109.1 kg)  11/12/17 236 lb (107 kg)     Other studies personally reviewed: Additional studies/ records that were reviewed today include: ECG 01/12/2018 personally reviewed  Review of the above records today demonstrates:   Sinus bradycardia   ASSESSMENT & PLAN:    1.  Paroxysmal atrial fibrillation: Currently not anticoagulated as he has had significant GI and vaginal bleeding on both Eliquis and Xarelto.  Overall she is done well.  She has noted minimal atrial fibrillation over the last year.  No changes.  This patients CHA2DS2-VASc Score and unadjusted Ischemic Stroke Rate (% per year) is equal to 2.2 % stroke rate/year from a score of 2  Above score calculated as 1 point each if present [CHF, HTN, DM, Vascular=MI/PAD/Aortic Plaque, Age if 65-74, or Female] Above score calculated as 2 points each if present [Age > 75, or Stroke/TIA/TE]  2.  Hypertension: Currently well controlled usually at home.  It is mildly elevated on her most recent check but this was prior to her medications.   COVID 19 screen The patient denies symptoms of COVID 19 at this time.  The importance of social distancing was discussed today.  Follow-up: 1 year  Current medicines are reviewed at length with the patient today.   The patient does not have concerns regarding her medicines.  The following changes were made today:  none  Labs/ tests ordered today include:  No orders of the defined types were placed in this encounter.    Patient Risk:  after full review of this patients clinical status, I feel that they are at moderate risk at this time.  Today, I have spent 15 minutes with the patient with telehealth technology discussing atrial fibrillation.    Signed, Jaylea Plourde Jorja LoaMartin Criag Wicklund, MD  03/16/2019 8:58 AM     [facility] [address] [place] [address] [phone] (office)  [phone] (fax)

## 2019-04-29 IMAGING — MG DIGITAL DIAGNOSTIC UNILATERAL RIGHT MAMMOGRAM WITH TOMO AND CAD
4 series · 4 of 12 positions shown · non-contrast
Comparison: Screening mammogram dated 12/08/2017.

CLINICAL DATA: Screening recall for right breast mass.

EXAM:
DIGITAL DIAGNOSTIC UNILATERAL RIGHT MAMMOGRAM WITH CAD AND TOMO
RIGHT BREAST ULTRASOUND

[R CC synth-2D]
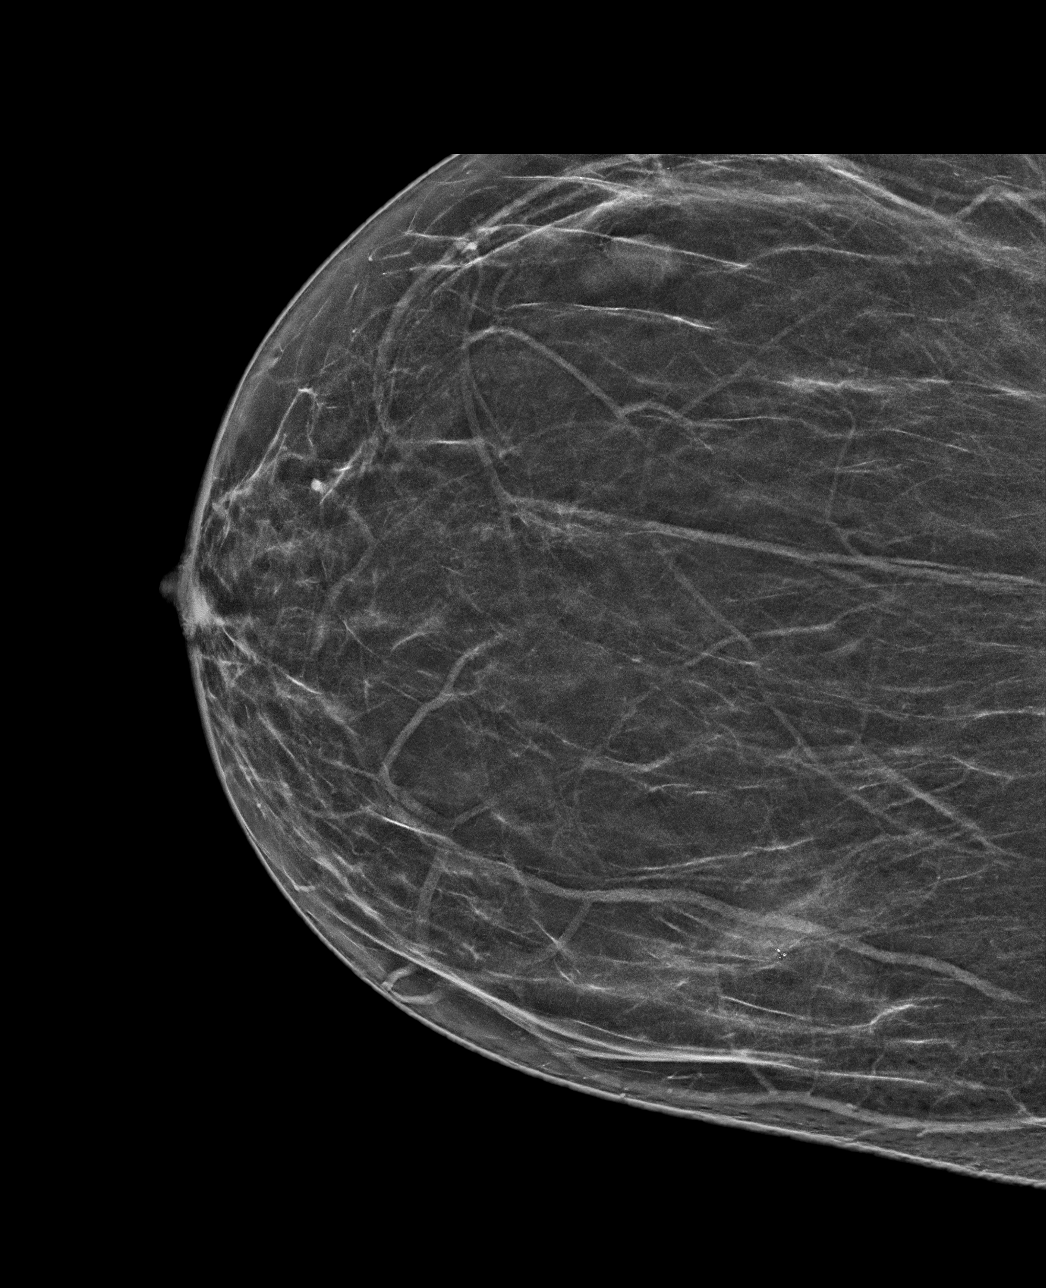

[R MLO synth-2D]
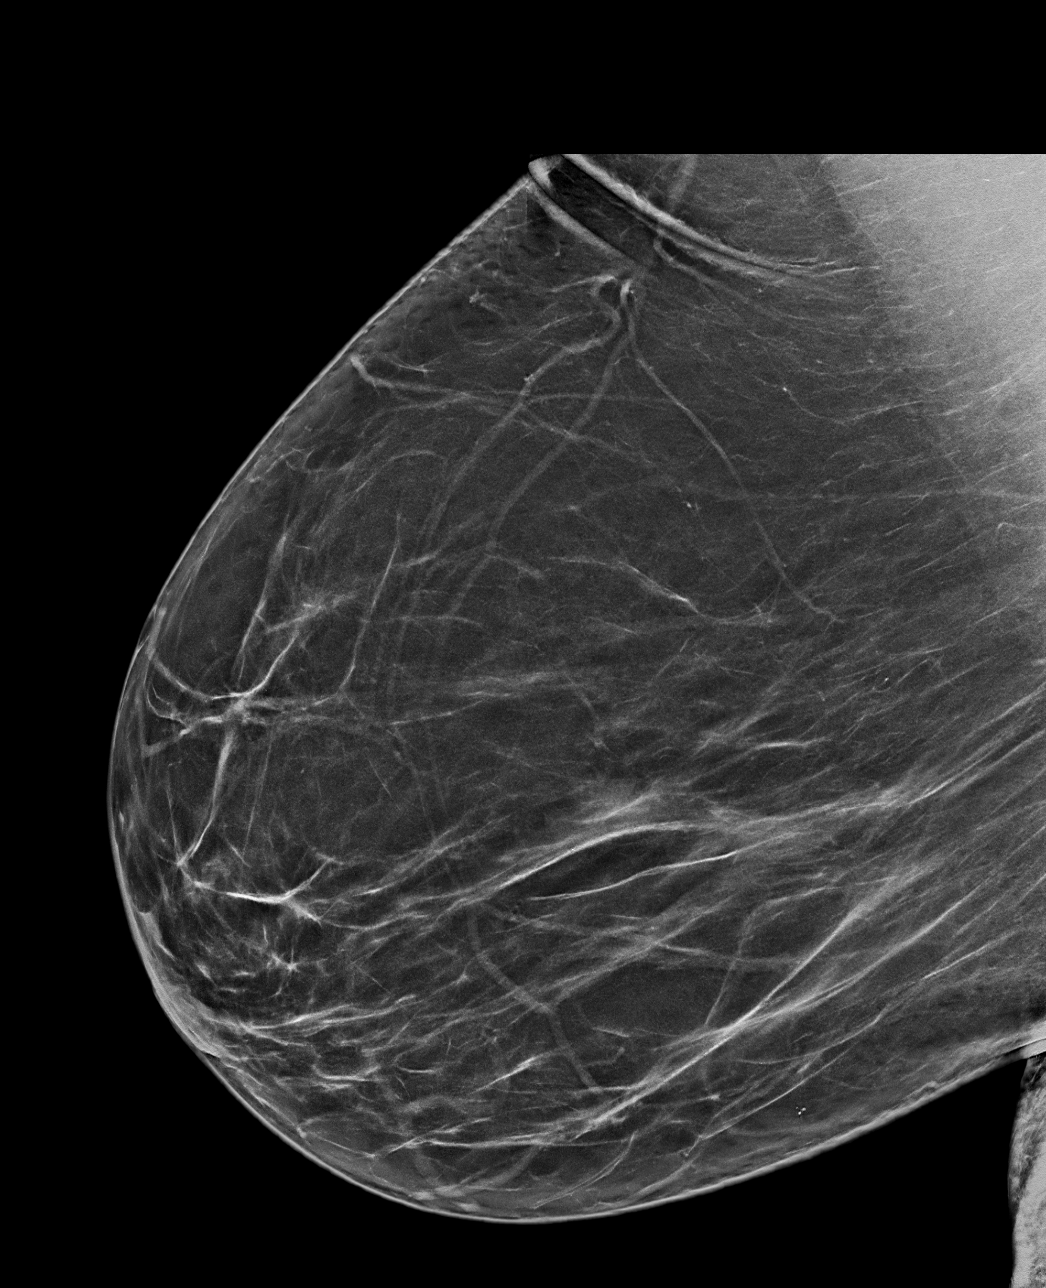

[R MLO tomo · tomo slice 43/84.0]
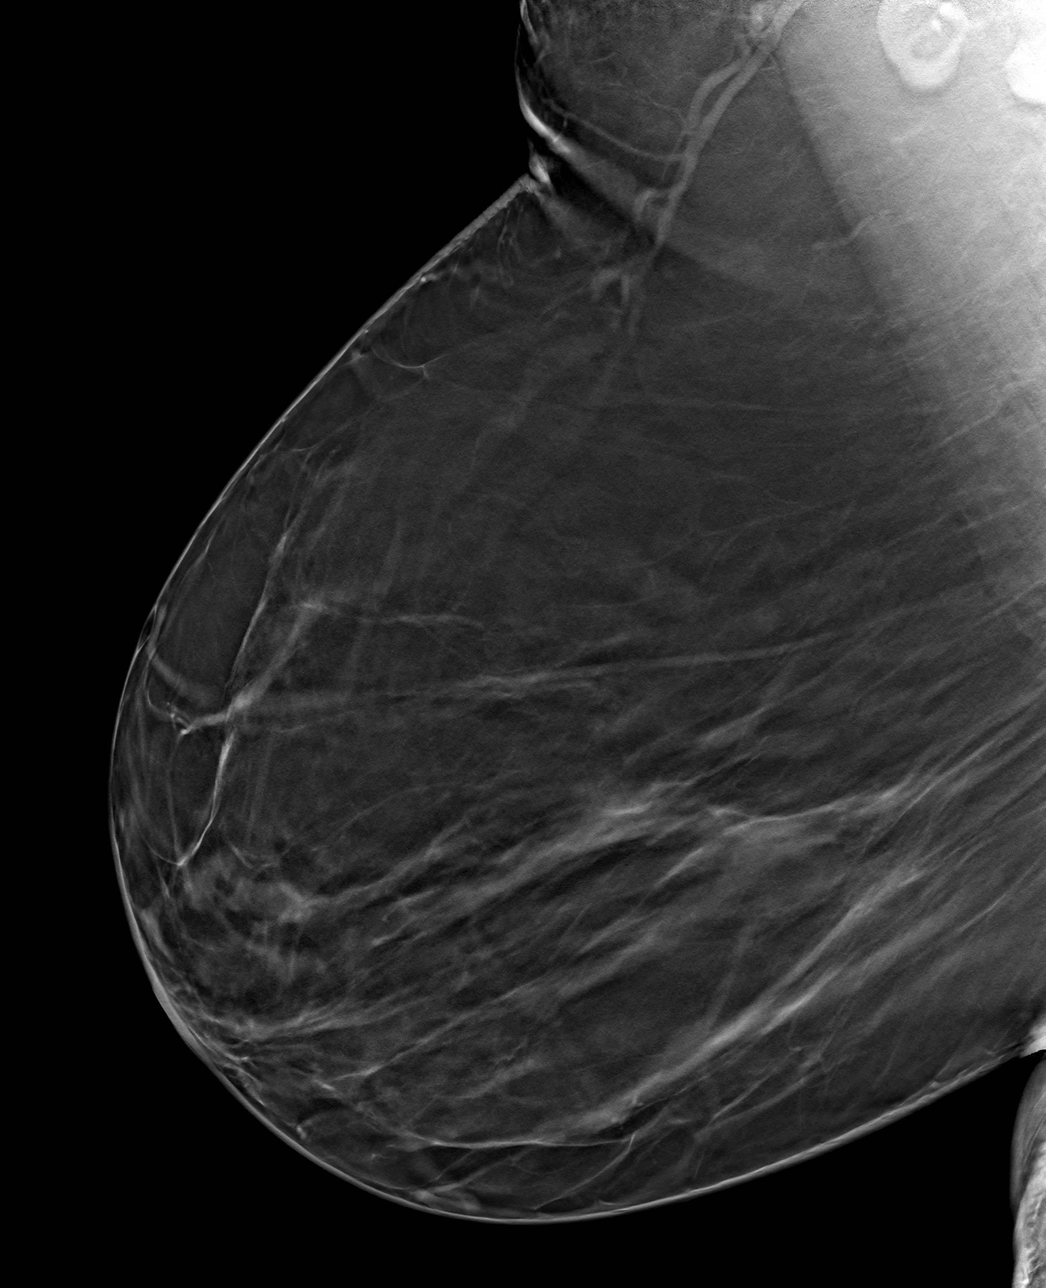

[R CC tomo · tomo slice 35/68.0]
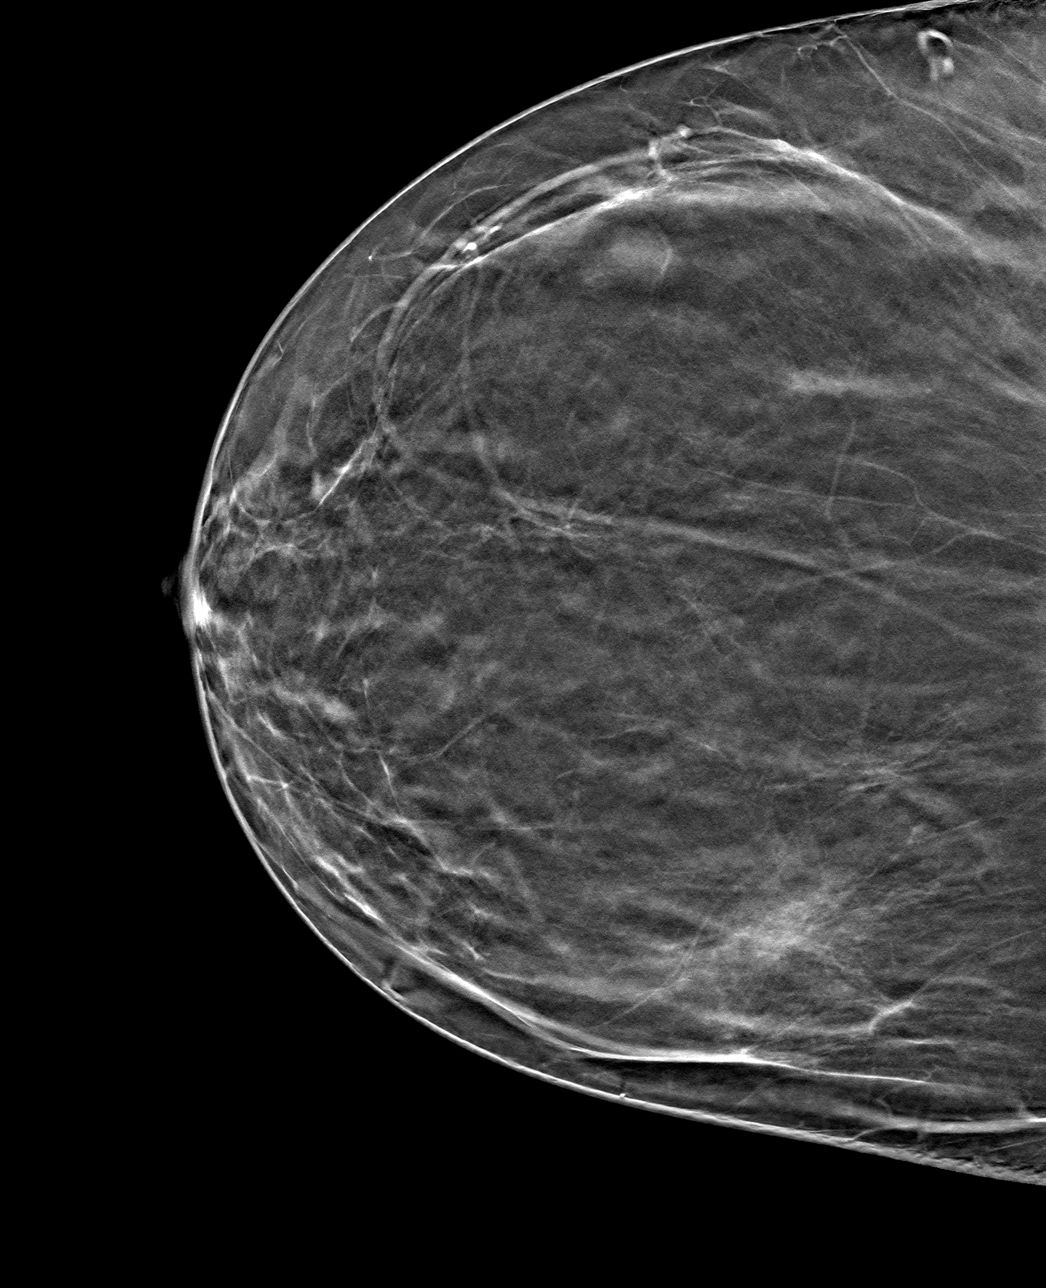

[4 of 12 positions shown; findings below may reference images not displayed]

ACR Breast Density Category b: There are scattered areas of
fibroglandular density.
FINDINGS: Cc and MLO tomograms were performed of the right breast
demonstrating an oval circumscribed mass in the outer right breast
measuring approximately 2 cm.

Mammographic images were processed with CAD.

Targeted ultrasound of the outer right breast was performed
demonstrating an oval well-circumscribed hypoechoic mass at 9
o'clock 8 cm from nipple measuring 1.1 x 0.4 x 1.8 cm. This
corresponds well with the mass seen in the right breast at
mammography and demonstrates imaging features most suggestive of a
benign fibroadenoma.
IMPRESSION: Probably benign right breast mass.

RECOMMENDATION:
Right breast ultrasound in 6 months.

I have discussed the findings and recommendations with the patient.
Results were also provided in writing at the conclusion of the
visit. If applicable, a reminder letter will be sent to the patient
regarding the next appointment.

BI-RADS CATEGORY  3: Probably benign.

## 2019-06-03 ENCOUNTER — Other Ambulatory Visit: Payer: Self-pay | Admitting: Family Medicine

## 2019-06-04 NOTE — Telephone Encounter (Signed)
Will provide refill once patient makes appointment with me. Has not been seen since 2018.

## 2019-06-04 NOTE — Telephone Encounter (Signed)
Spoke with pt. She stated that she wanted to do her physical/ med refill. She made an appt for 06/14/2019 at 10:10. Salvatore Marvel, CMA

## 2019-06-14 ENCOUNTER — Ambulatory Visit (INDEPENDENT_AMBULATORY_CARE_PROVIDER_SITE_OTHER): Payer: Self-pay | Admitting: Family Medicine

## 2019-06-14 ENCOUNTER — Telehealth: Payer: Self-pay | Admitting: *Deleted

## 2019-06-14 ENCOUNTER — Other Ambulatory Visit: Payer: Self-pay | Admitting: Family Medicine

## 2019-06-14 ENCOUNTER — Other Ambulatory Visit: Payer: Self-pay

## 2019-06-14 ENCOUNTER — Telehealth: Payer: Self-pay | Admitting: Family Medicine

## 2019-06-14 ENCOUNTER — Encounter: Payer: Self-pay | Admitting: Family Medicine

## 2019-06-14 VITALS — BP 134/68 | HR 57 | Ht 67.0 in

## 2019-06-14 DIAGNOSIS — E038 Other specified hypothyroidism: Secondary | ICD-10-CM

## 2019-06-14 DIAGNOSIS — Z0000 Encounter for general adult medical examination without abnormal findings: Secondary | ICD-10-CM

## 2019-06-14 DIAGNOSIS — M5441 Lumbago with sciatica, right side: Secondary | ICD-10-CM

## 2019-06-14 DIAGNOSIS — I10 Essential (primary) hypertension: Secondary | ICD-10-CM

## 2019-06-14 DIAGNOSIS — Z1211 Encounter for screening for malignant neoplasm of colon: Secondary | ICD-10-CM

## 2019-06-14 DIAGNOSIS — D649 Anemia, unspecified: Secondary | ICD-10-CM

## 2019-06-14 DIAGNOSIS — M5442 Lumbago with sciatica, left side: Secondary | ICD-10-CM

## 2019-06-14 DIAGNOSIS — G8929 Other chronic pain: Secondary | ICD-10-CM

## 2019-06-14 DIAGNOSIS — N63 Unspecified lump in unspecified breast: Secondary | ICD-10-CM

## 2019-06-14 DIAGNOSIS — I480 Paroxysmal atrial fibrillation: Secondary | ICD-10-CM

## 2019-06-14 MED ORDER — ZOSTER VAC RECOMB ADJUVANTED 50 MCG/0.5ML IM SUSR: 0.5000 mL | Freq: Once | INTRAMUSCULAR | 0 refills | Status: AC

## 2019-06-14 MED ORDER — LIDOCAINE 5 % EX PTCH: 1.0000 | MEDICATED_PATCH | CUTANEOUS | 2 refills | Status: AC

## 2019-06-14 MED ORDER — GABAPENTIN 100 MG PO CAPS: 100.0000 mg | ORAL_CAPSULE | Freq: Every day | ORAL | 3 refills | Status: AC

## 2019-06-14 NOTE — Progress Notes (Signed)
Subjective:   Patient ID: Kristi Duncan    DOB: Aug 20, 1961, 58 y.o. female   MRN: [id number]  Kristi EasternMazie F Sirmon is a 58 y.o. female with a history of HTN, afib, hypothyroidism, sickle cell trait, HLD, h/o stroke here for well woman/preventative visit   Acute Concerns:  - chronic back lower back and bilateral hip pain - takes tylenol 500mg . Previously taken dilaudid prn, last script #5 pills, last filled 04/2017. Weaned herself off gabapentin 100mg  though did see an increase in her pain after this. Previously had good effect with physical therapy. Denies recent trauma or falls. Pain described as spasm in lower back and down legs. Currently lives alone, daughter lives close by. - PAF - followed by Cardiology. Takes cardizem prn. Previously on eliquis and xarelto but couldn't tolerate, also with previous bleeding. Atenolol daily. No low Bps on amlodipine (SBP 120-130s at home)  Sexual/Birth History: N5A2130: G4P4004, currently sexually active with one female partner. No concerns for STI  Birth Control: post-menopausal, hysterectomy 2000s  POA/Living Will: No. Current wishes for full code.   Review of Systems: Per HPI.    PMH, PSH, Medications, Allergies, and FmHx reviewed and updated in EMR.  Social History: former smoker. Quit 40 years ago. Only smoked a few cigarettes off and on while at her prior job in her 5520s.  Immunization:  Tdap/TD: due  Influenza: n/a  Pneumococcal: n/a  Shingrix: due  Cancer Screening:  Pap Smear: n/a  Mammogram: due  Colonoscopy: due, negative FIT 12/2017  Dexa: due  Review of Systems:  Per HPI.  PMFSH, medications and smoking status reviewed.  Objective:   BP 134/68   Pulse (!) 57   Ht 5\' 7"  (1.702 m)   SpO2 99%   BMI 37.43 kg/m  Vitals and nursing note reviewed.  General: obese elderly female, in no acute distress with non-toxic appearance CV: regular rate and rhythm without murmurs, rubs, or gallops, no lower extremity edema Lungs: clear to  auscultation bilaterally with normal work of breathing Abdomen: soft, non-tender, non-distended, normoactive bowel sounds Skin: warm, dry, no rashes or lesions Extremities: warm and well perfused, normal tone MSK: Strength intact, ambulates with cane. Some difficulties rising from sitting to standing. No pain with palpation of paravertebral musculature. Neuro: Alert and oriented, speech normal  Assessment & Plan:   Hypertension At goal without lows.  Continue current regimen.  Hypothyroidism With prior history mother not currently on replacement, will obtain repeat TSH.  Is currently without symptoms.  Chronic back pain No red flags on exam or history.  No recent worsening or trauma.  Recommended trying gabapentin 100 mg again as patient had good relief with this with minimal side effects.  We will also trial lidocaine patches at patient request.  Previously on Dilaudid as needed and uses this very sparingly, no refills obtained in the last 2 years.  Also previously with good relief with physical therapy but given recent insurance [facility], likely would not be covered, patient will call insurance for clarification prior to referral.  Health care maintenance Prescription provided for Shingrix vaccine.  Order placed for FIT testing.  Patient is due for repeat mammogram follow-up for breast mass, patient will call breast center to see if they take her new insurance.  Also will need DEXA scan given postmenopausal state, but will likely need to be [facility].  Counseling provided today regarding POA and living will however patient does not desire this at this time, currently  wishes to remain full code.  Atrial fibrillation (HCC) Paroxysmal.  Follows with cardiology.  On rate control with atenolol and as needed Cardizem.  Not currently on anticoagulation though has previously been on Eliquis and Xarelto though with prior bleeding.  CHA2DS2-VASc score of 2.   Orders Placed This Encounter  Procedures  . Fecal occult blood, imunochemical([facility])  . CBC  . TSH  . Basic Metabolic Panel  . Ambulatory referral to Breast Clinic    Referral Priority:   Routine    Referral Type:   Consultation    Referral Reason:   Specialty Services Required    Requested Specialty:   Breast Surgery    Number of Visits Requested:   1   Meds ordered this encounter  Medications  . gabapentin (NEURONTIN) 100 MG capsule    Sig: Take 1 capsule (100 mg total) by mouth at bedtime.    Dispense:  90 capsule    Refill:  3  . lidocaine (LIDODERM) 5 %    Sig: Place 1 patch onto the skin daily. Remove & Discard patch within 12 hours or as directed by MD    Dispense:  30 patch    Refill:  2  . Zoster Vaccine Adjuvanted [facility]) injection    Sig: Inject 0.5 mLs into the muscle once for 1 dose. Ok to give second dose at appropriate interval.    Dispense:  0.5 mL    Refill:  0    Rory Percy, DO PGY-3, [facility] 06/15/2019 10:39 AM

## 2019-06-14 NOTE — Patient Instructions (Addendum)
It was great to see you!  Our plans for today:  - We are refilling your gabapentin.  Take this at night.  We are also trying lidocaine patches for your back pain.  If you decide you want to continue with physical therapy, call our office and we can get this referral placed for you. -Take the Shingrix vaccine prescription to the pharmacy.  The second dose is given 2-6 months after the first dose. -Call the breast center to see if they will take your new insurance to follow-up on your breast mass. -We will see about getting you set up to do a stool card to check for colon cancer.  We are checking some labs today, we will call you or send you a letter if they are abnormal.   Take care and seek immediate care sooner if you develop any concerns.   Dr. [facility]

## 2019-06-14 NOTE — Telephone Encounter (Signed)
Received fax from pharmacy, PA needed on lidocaine patches.  Clinical questions submitted via Cover My Meds.  Waiting on response, could take up to 72 hours.  Cover My Meds info: Key: [id number]  Christen Bame, CMA

## 2019-06-14 NOTE — Telephone Encounter (Signed)
Patient needs referral to [facility].  Phone [phone] and fax [phone].put to attention of Referral Coordinator.  Patient stopped by this afternoon with this request.  She was seen today 06/14/19 by Dr. Ky Barban.  If you need to contact patient about any information her phone # [phone]

## 2019-06-14 NOTE — Telephone Encounter (Signed)
Done

## 2019-06-15 ENCOUNTER — Other Ambulatory Visit: Payer: Self-pay | Admitting: Family Medicine

## 2019-06-15 DIAGNOSIS — R7989 Other specified abnormal findings of blood chemistry: Secondary | ICD-10-CM

## 2019-06-15 LAB — BASIC METABOLIC PANEL
BUN/Creatinine Ratio: 14 (ref 12–28)
BUN: 16 mg/dL (ref 8–27)
CO2: 22 mmol/L (ref 20–29)
Calcium: 9.5 mg/dL (ref 8.7–10.3)
Chloride: 105 mmol/L (ref 96–106)
Creatinine, Ser: 1.11 mg/dL — ABNORMAL HIGH (ref 0.57–1.00)
GFR calc Af Amer: 62 mL/min/{1.73_m2} (ref >59)
GFR calc non Af Amer: 54 mL/min/{1.73_m2} — ABNORMAL LOW (ref >59)
Glucose: 105 mg/dL — ABNORMAL HIGH (ref 65–99)
Potassium: 4.3 mmol/L (ref 3.5–5.2)
Sodium: 142 mmol/L (ref 134–144)

## 2019-06-15 LAB — TSH: TSH: 2.17 u[IU]/mL (ref 0.450–4.500)

## 2019-06-15 LAB — CBC
Hematocrit: 37.3 % (ref 34.0–46.6)
Hemoglobin: 12.7 g/dL (ref 11.1–15.9)
MCH: 29.5 pg (ref 26.6–33.0)
MCHC: 34 g/dL (ref 31.5–35.7)
MCV: 87 fL (ref 79–97)
Platelets: 262 10*3/uL (ref 150–450)
RBC: 4.3 x10E6/uL (ref 3.77–5.28)
RDW: 13.9 % (ref 11.7–15.4)
WBC: 4.9 10*3/uL (ref 3.4–10.8)

## 2019-06-15 NOTE — Telephone Encounter (Signed)
Determination will be faxed, please let me know when you receive so that I may document. Christen Bame, CMA

## 2019-06-15 NOTE — Assessment & Plan Note (Signed)
Paroxysmal.  Follows with cardiology.  On rate control with atenolol and as needed Cardizem.  Not currently on anticoagulation though has previously been on Eliquis and Xarelto though with prior bleeding.  CHA2DS2-VASc score of 2.

## 2019-06-15 NOTE — Assessment & Plan Note (Signed)
With prior history mother not currently on replacement, will obtain repeat TSH.  Is currently without symptoms.

## 2019-06-15 NOTE — Assessment & Plan Note (Signed)
At goal without lows.  Continue current regimen.

## 2019-06-15 NOTE — Assessment & Plan Note (Signed)
No red flags on exam or history.  No recent worsening or trauma.  Recommended trying gabapentin 100 mg again as patient had good relief with this with minimal side effects.  We will also trial lidocaine patches at patient request.  Previously on Dilaudid as needed and uses this very sparingly, no refills obtained in the last 2 years.  Also previously with good relief with physical therapy but given recent insurance switch to [facility] local, likely would not be covered, patient will call insurance for clarification prior to referral.

## 2019-06-15 NOTE — Assessment & Plan Note (Addendum)
Prescription provided for Shingrix vaccine.  Order placed for FIT testing.  Patient is due for repeat mammogram follow-up for breast mass, patient will call breast center to see if they take her new insurance.  Also will need DEXA scan given postmenopausal state, but will likely need to be referred within the [facility] system.  Counseling provided today regarding POA and living will however patient does not desire this at this time, currently wishes to remain full code.

## 2019-06-17 LAB — FECAL OCCULT BLOOD, IMMUNOCHEMICAL: Fecal Occult Bld: NEGATIVE

## 2019-06-22 NOTE — Telephone Encounter (Signed)
Was denied

## 2019-06-22 NOTE — Telephone Encounter (Signed)
Medication is only approved for use "lasting nerve and skin pain caused by shingles" Christen Bame, CMA

## 2019-06-23 NOTE — Telephone Encounter (Signed)
Pt calls to check the status.  Informed that insurance will not cover.  She wants to know if she should try to the OTC ones?   To PCP. Christen Bame, CMA

## 2019-06-23 NOTE — Telephone Encounter (Signed)
Yes she certainly can. I think they are about $10 at [employer] or [employer].

## 2019-06-23 NOTE — Telephone Encounter (Signed)
Pt has not heard from [facility], will refax. Christen Bame, CMA

## 2019-06-23 NOTE — Telephone Encounter (Signed)
Pt informed.  Zamaya Rapaport, CMA  

## 2019-07-05 ENCOUNTER — Telehealth: Payer: Self-pay

## 2019-07-05 NOTE — Telephone Encounter (Signed)
Hmm not sure. Looks like it's just supposed to be a lab only appointment to recheck creatinine. She doesn't need to see me.

## 2019-07-05 NOTE — Telephone Encounter (Signed)
Patient called nurse line stating "someone: just called her in regards to her apt tomorrow. I see where it was cancelled, however not sure why. Patient stated she was supposed to come in tomorrow for a lab only apt. Do you know why this was cancelled? Is she still supposed to come in? Please advise.

## 2019-07-06 ENCOUNTER — Other Ambulatory Visit: Payer: BLUE CROSS/BLUE SHIELD

## 2019-07-07 NOTE — Telephone Encounter (Signed)
Patient contacted and rescheduled for her lab draw. Patient scheduled for tomorrow at 1030am.

## 2019-07-08 ENCOUNTER — Other Ambulatory Visit: Payer: Self-pay

## 2019-07-08 ENCOUNTER — Other Ambulatory Visit: Payer: BLUE CROSS/BLUE SHIELD

## 2019-07-08 DIAGNOSIS — R7989 Other specified abnormal findings of blood chemistry: Secondary | ICD-10-CM

## 2019-07-09 LAB — BASIC METABOLIC PANEL
BUN/Creatinine Ratio: 13 (ref 12–28)
BUN: 13 mg/dL (ref 8–27)
CO2: 22 mmol/L (ref 20–29)
Calcium: 9.1 mg/dL (ref 8.7–10.3)
Chloride: 107 mmol/L — ABNORMAL HIGH (ref 96–106)
Creatinine, Ser: 0.98 mg/dL (ref 0.57–1.00)
GFR calc Af Amer: 73 mL/min/{1.73_m2} (ref >59)
GFR calc non Af Amer: 63 mL/min/{1.73_m2} (ref >59)
Glucose: 102 mg/dL — ABNORMAL HIGH (ref 65–99)
Potassium: 4.2 mmol/L (ref 3.5–5.2)
Sodium: 141 mmol/L (ref 134–144)

## 2019-10-28 IMAGING — US US BREAST*R* LIMITED INC AXILLA
1 series · 5 of 5 positions shown · non-contrast
Comparison: 01/01/2018 mammogram and ultrasound

CLINICAL DATA: 57-year-old female for follow-up of RIGHT breast
mass.

EXAM:
ULTRASOUND OF THE RIGHT BREAST

[Series 1: us breast*right* limited inc axilla · 0.07mm/px · 5 of 5 slices shown]
[im 1/5]
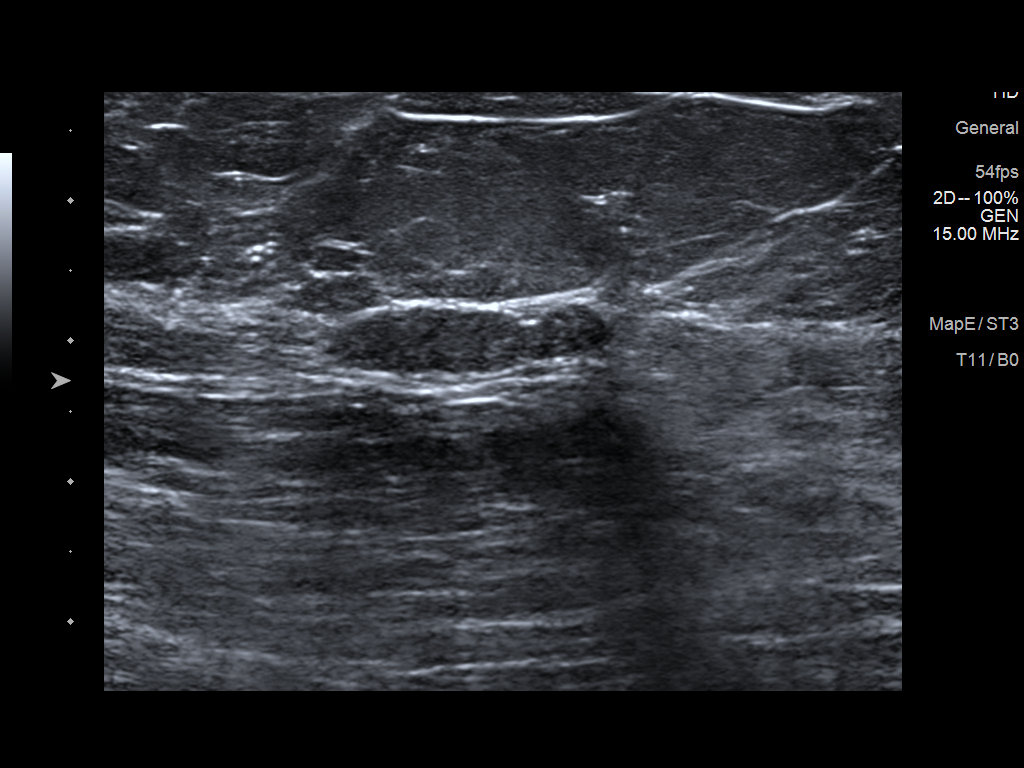
[im 2/5]
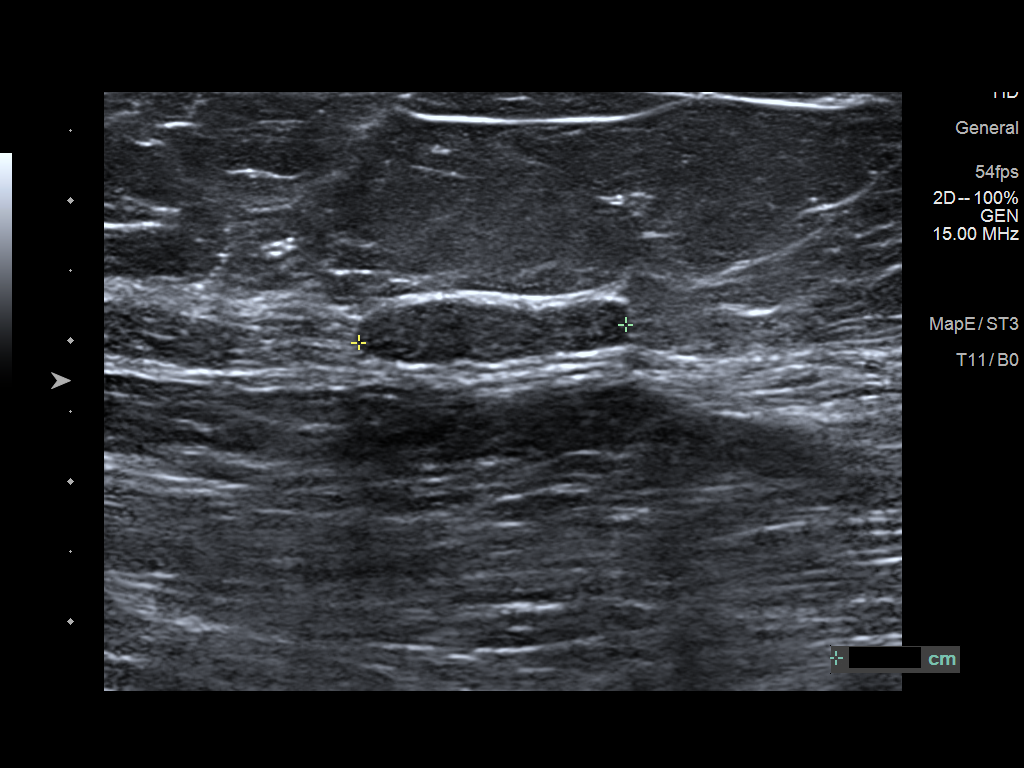
[im 3/5]
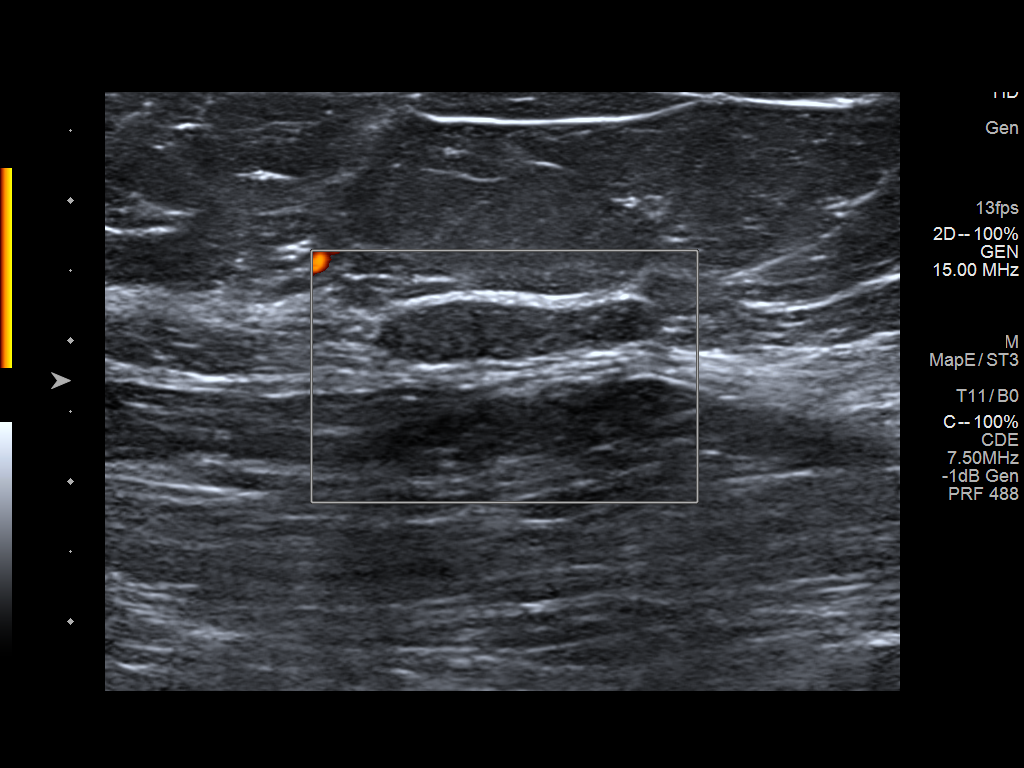
[im 4/5]
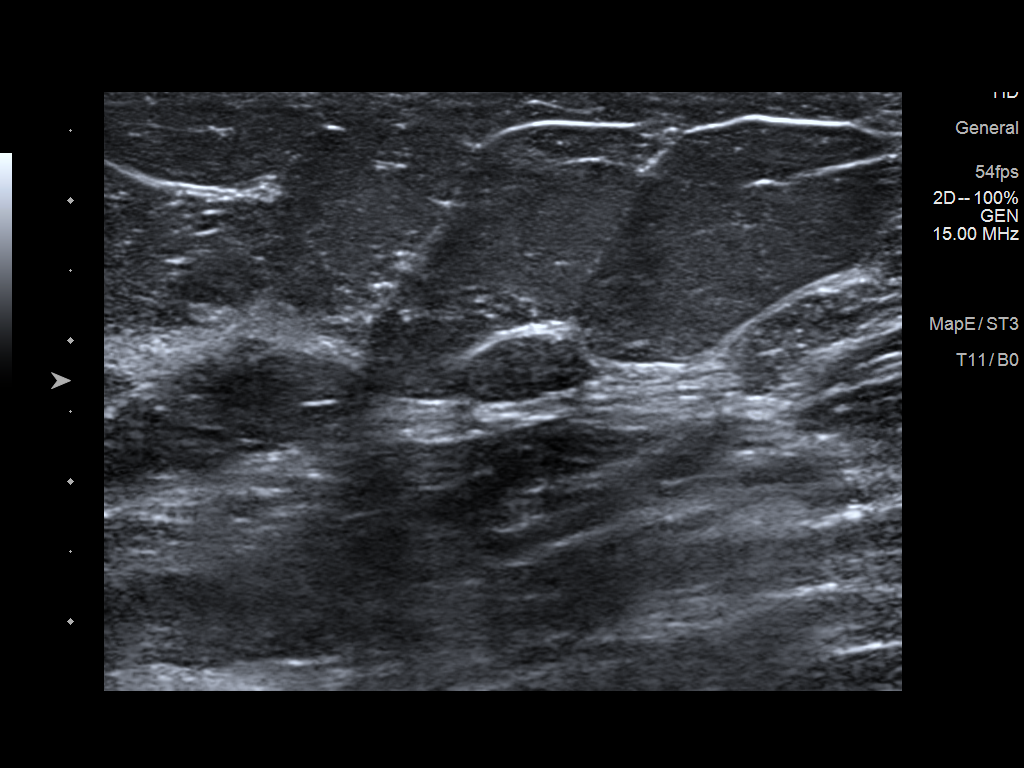
[im 5/5]
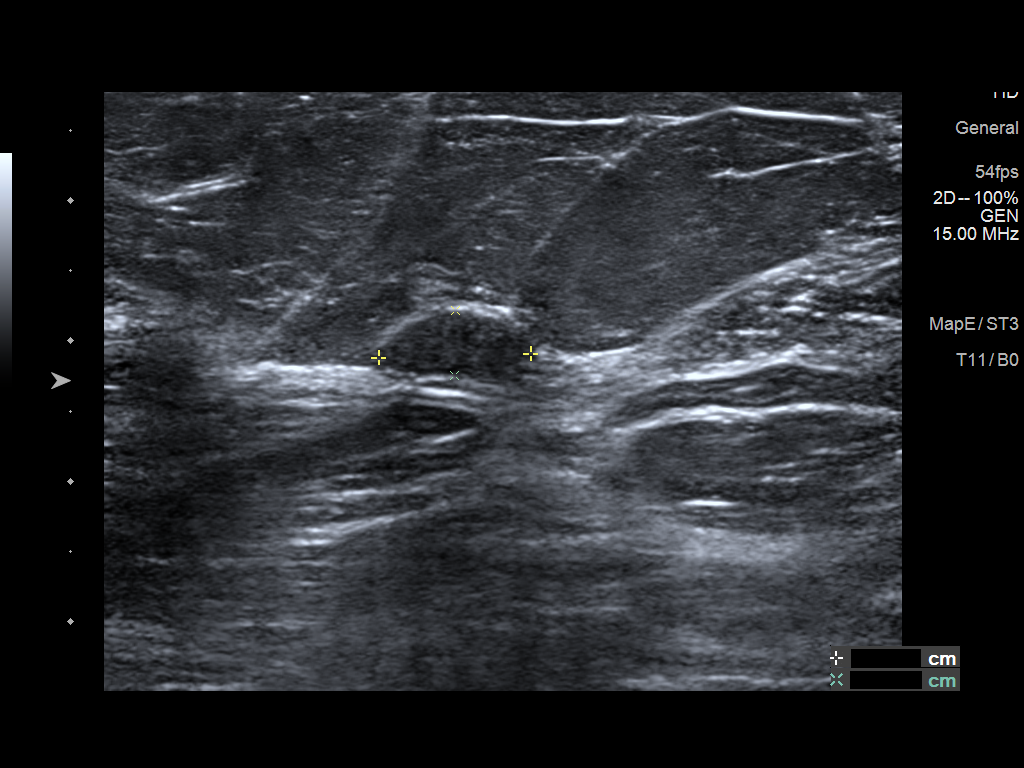

[5 of 5 positions shown; findings below may reference images not displayed]

FINDINGS: Targeted ultrasound is performed, showing a stable 1.1 x 0.5 x
cm circumscribed oval hypoechoic parallel mass at the 9 o'clock
position of the RIGHT breast 8 cm from the nipple, likely
representing a fibroadenoma.
IMPRESSION: Stable 1.9 cm mass within the OUTER RIGHT breast, likely
representing a fibroadenoma. Six-month followup is recommended to
ensure 1 year stability.

RECOMMENDATION:
Bilateral diagnostic mammogram and RIGHT breast ultrasound in 6
months to resume annual mammogram schedule and to reassess likely
benign RIGHT breast mass.

I have discussed the findings and recommendations with the patient.
Results were also provided in writing at the conclusion of the
visit. If applicable, a reminder letter will be sent to the patient
regarding the next appointment.

BI-RADS CATEGORY  3: Probably benign.

## 2020-05-08 DIAGNOSIS — Z0271 Encounter for disability determination: Secondary | ICD-10-CM

## 2020-07-25 ENCOUNTER — Encounter (HOSPITAL_COMMUNITY): Payer: Self-pay

## 2020-07-25 ENCOUNTER — Emergency Department (HOSPITAL_COMMUNITY): Payer: BLUE CROSS/BLUE SHIELD

## 2020-07-25 ENCOUNTER — Inpatient Hospital Stay (HOSPITAL_COMMUNITY)
Admission: EM | Admit: 2020-07-25 | Discharge: 2020-07-27 | DRG: 309 | Disposition: A | Discharge status: Home / Self Care | Payer: BLUE CROSS/BLUE SHIELD | Attending: Internal Medicine | Admitting: Internal Medicine

## 2020-07-25 DIAGNOSIS — Z79899 Other long term (current) drug therapy: Secondary | ICD-10-CM

## 2020-07-25 DIAGNOSIS — R7303 Prediabetes: Secondary | ICD-10-CM | POA: Diagnosis present

## 2020-07-25 DIAGNOSIS — I480 Paroxysmal atrial fibrillation: Secondary | ICD-10-CM | POA: Diagnosis not present

## 2020-07-25 DIAGNOSIS — I4891 Unspecified atrial fibrillation: Secondary | ICD-10-CM | POA: Diagnosis not present

## 2020-07-25 DIAGNOSIS — M549 Dorsalgia, unspecified: Secondary | ICD-10-CM | POA: Diagnosis present

## 2020-07-25 DIAGNOSIS — G8929 Other chronic pain: Secondary | ICD-10-CM | POA: Diagnosis present

## 2020-07-25 DIAGNOSIS — Z87891 Personal history of nicotine dependence: Secondary | ICD-10-CM

## 2020-07-25 DIAGNOSIS — N179 Acute kidney failure, unspecified: Secondary | ICD-10-CM | POA: Diagnosis present

## 2020-07-25 DIAGNOSIS — Z9049 Acquired absence of other specified parts of digestive tract: Secondary | ICD-10-CM

## 2020-07-25 DIAGNOSIS — Z8673 Personal history of transient ischemic attack (TIA), and cerebral infarction without residual deficits: Secondary | ICD-10-CM

## 2020-07-25 DIAGNOSIS — E785 Hyperlipidemia, unspecified: Secondary | ICD-10-CM | POA: Diagnosis present

## 2020-07-25 DIAGNOSIS — E039 Hypothyroidism, unspecified: Secondary | ICD-10-CM | POA: Diagnosis present

## 2020-07-25 DIAGNOSIS — I10 Essential (primary) hypertension: Secondary | ICD-10-CM | POA: Diagnosis present

## 2020-07-25 DIAGNOSIS — F419 Anxiety disorder, unspecified: Secondary | ICD-10-CM | POA: Diagnosis present

## 2020-07-25 DIAGNOSIS — Z90710 Acquired absence of both cervix and uterus: Secondary | ICD-10-CM

## 2020-07-25 DIAGNOSIS — Z20822 Contact with and (suspected) exposure to covid-19: Secondary | ICD-10-CM | POA: Diagnosis present

## 2020-07-25 HISTORY — DX: Hypothyroidism, unspecified: E03.9

## 2020-07-25 HISTORY — DX: Transient cerebral ischemic attack, unspecified: G45.9

## 2020-07-25 HISTORY — DX: Unspecified osteoarthritis, unspecified site: M19.90

## 2020-07-25 HISTORY — DX: Paroxysmal atrial fibrillation: I48.0

## 2020-07-25 HISTORY — DX: Obesity, unspecified: E66.9

## 2020-07-25 LAB — CBC
HCT: 37 % (ref 36.0–46.0)
Hemoglobin: 12 g/dL (ref 12.0–15.0)
MCH: 28.5 pg (ref 26.0–34.0)
MCHC: 32.4 g/dL (ref 30.0–36.0)
MCV: 87.9 fL (ref 80.0–100.0)
Platelets: 255 10*3/uL (ref 150–400)
RBC: 4.21 MIL/uL (ref 3.87–5.11)
RDW: 13.5 % (ref 11.5–15.5)
WBC: 7.7 10*3/uL (ref 4.0–10.5)
nRBC: 0 % (ref 0.0–0.2)

## 2020-07-25 MED ORDER — DILTIAZEM LOAD VIA INFUSION
15.0000 mg | Freq: Once | INTRAVENOUS | Status: AC
  Administered 2020-07-26: 15 mg via INTRAVENOUS
  Filled 2020-07-25: qty 15

## 2020-07-25 MED ORDER — DILTIAZEM HCL-DEXTROSE 125-5 MG/125ML-% IV SOLN (PREMIX)
5.0000 mg/h | INTRAVENOUS | Status: DC
  Administered 2020-07-26: 5 mg/h via INTRAVENOUS
  Filled 2020-07-25: qty 125

## 2020-07-25 NOTE — ED Triage Notes (Signed)
Pt BIB [facility] for eval of afib RVR. Pt reports she initially called EMS out for palpitations while laying in bed. Pt stood up to check BP and felt lightheaded and called EMS. Pt reports chest "discomfort". Pt is not always in afib, takes cardizem prn for afib. Last episode reported in May. rec'd 500cc saline by EMS and 120mg  Cardizem PO at home.

## 2020-07-25 NOTE — ED Provider Notes (Signed)
[facility]ENC[facility] Provider Note   CSN: [id number] Arrival date & time: 07/25/20  2256     History Chief Complaint  Patient presents with  . Atrial Fibrillation    Kristi Duncan is a 59 y.o. female with a history of afib not currently anticoagulated, hypertension, hyperlipidemia, & hypothyroidism who presents to the ED via EMS with complaints of an episode of atrial fibrillation which began shortly prior to arrival.  Patient states that she was getting ready for bed when she had onset of palpitations and a funny sensation in her chest, this persisted and she started to feel a bit lightheaded and short of breath.  She states this feels exactly like her prior episodes of A. fib, she took a dose of her oral 120 mg Cardizem at home and called 911.  EMS identified A. fib with a rate in the 120s to 160s, gave 500 cc of saline in route.  Patient denies any alleviating or aggravating factors to her symptoms.  She states this feels similar to when she has had to be admitted for A. fib.  She is not anticoagulated.  [facility], she previously saw Dr. Elberta Fortisamnitz with [facility] but had to change due to insurance issues.  She denies chest pain, syncope, leg pain/swelling, vomiting, diaphoresis, or abdominal pain.  HPI     Past Medical History:  Diagnosis Date  . A-fib (HCC)   . Chronic back pain   . Hyperlipidemia   . Hypertension   . Thyroid disease     Patient Active Problem List   Diagnosis Date Noted  . TIA (transient ischemic attack) 04/18/2017  . Stroke determined by clinical assessment (HCC)   . Chronic back pain   . Sickle cell trait (HCC) 02/05/2016  . Hypertension 02/05/2016  . Hyperlipidemia 02/05/2016  . Hypothyroidism 02/05/2016  . Vitamin D deficiency 02/05/2016  . Health care maintenance 02/05/2016  . Atrial fibrillation (HCC) 02/05/2016    Past Surgical History:  Procedure Laterality Date  . ABDOMINAL HYSTERECTOMY   2008  . CHOLECYSTECTOMY  1992     OB History    Gravida  4   Para      Term      Preterm      AB      Living  4     SAB      TAB      Ectopic      Multiple      Live Births              Family History  Problem Relation Age of Onset  . Heart disease Maternal Grandmother   . Stroke Maternal Grandfather     Social History   Tobacco Use  . Smoking status: Former Smoker    Types: Cigarettes    Quit date: 11/19/1991    Years since quitting: 28.7  . Smokeless tobacco: Never Used  Vaping Use  . Vaping Use: Never used  Substance Use Topics  . Alcohol use: Yes    Comment: occasional   . Drug use: No    Home Medications Prior to Admission medications   Medication Sig Start Date End Date Taking? Authorizing Provider  acetaminophen (TYLENOL) 500 MG tablet Take 500 mg by mouth every 6 (six) hours as needed for mild pain, moderate pain, fever or headache.    [provider]  amLODipine (NORVASC) 5 MG tablet Take 1 tablet (5 mg total) by mouth daily. 03/10/19  Camnitz, Will Daphine Deutscher, MD  atenolol (TENORMIN) 25 MG tablet Take 1 tablet by mouth once daily 06/04/19   Ellwood Dense, MD  diltiazem (CARDIZEM) 120 MG tablet Take 1 tablet (120 mg total) by mouth 2 (two) times daily as needed (for fast heart rate). 03/10/19   Camnitz, Andree Coss, MD  gabapentin (NEURONTIN) 100 MG capsule Take 1 capsule (100 mg total) by mouth at bedtime. 06/14/19   Ellwood Dense, MD  HYDROmorphone (DILAUDID) 2 MG tablet Take 1 tablet (2 mg total) by mouth every 6 (six) hours as needed for severe pain. Patient not taking: Reported on 06/14/2019 05/12/17   MayoAllyn Kenner, MD  lidocaine (LIDODERM) 5 % Place 1 patch onto the skin daily. Remove & Discard patch within 12 hours or as directed by MD 06/14/19   Ellwood Dense, MD    Allergies    Eliquis [apixaban], Iodine, Pravastatin, Butorphanol, Celecoxib, Codeine, Hctz [hydrochlorothiazide], Hydrocodone, Iodinated diagnostic agents,  Prednisone, Simvastatin, Vitamin d analogs, Xarelto [rivaroxaban], and Nsaids  Review of Systems   Review of Systems  Constitutional: Negative for chills, diaphoresis and fever.  Respiratory: Positive for shortness of breath. Negative for cough.   Cardiovascular: Positive for palpitations. Negative for chest pain and leg swelling.  Gastrointestinal: Negative for abdominal pain, nausea and vomiting.  Genitourinary: Negative for dysuria.  Neurological: Positive for light-headedness. Negative for syncope.  All other systems reviewed and are negative.   Physical Exam Updated Vital Signs BP (!) 154/108 (BP Location: Right Arm)   Pulse (!) 134   Temp 98.4 F (36.9 C) (Temporal)   Resp 18   Ht 5\' 7"  (1.702 m)   Wt 113.4 kg   SpO2 100%   BMI 39.16 kg/m   Physical Exam Vitals and nursing note reviewed.  Constitutional:      General: She is not in acute distress.    Appearance: She is well-developed. She is not toxic-appearing.  HENT:     Head: Normocephalic and atraumatic.  Eyes:     General:        Right eye: No discharge.        Left eye: No discharge.     Conjunctiva/sclera: Conjunctivae normal.  Cardiovascular:     Rate and Rhythm: Tachycardia present.     Comments: Irregularly irregular.  Pulmonary:     Effort: Pulmonary effort is normal. No respiratory distress.     Breath sounds: Normal breath sounds. No wheezing, rhonchi or rales.  Abdominal:     General: There is no distension.     Palpations: Abdomen is soft.     Tenderness: There is no abdominal tenderness.  Musculoskeletal:     Cervical back: Neck supple.     Right lower leg: No edema.     Left lower leg: No edema.  Skin:    General: Skin is warm and dry.     Findings: No rash.  Neurological:     Mental Status: She is alert.     Comments: Clear speech.   Psychiatric:        Behavior: Behavior normal.     ED Results / Procedures / Treatments   Labs (all labs ordered are listed, but only abnormal  results are displayed) Labs Reviewed - No data to display  EKG EKG Interpretation  Date/Time:  Tuesday July 25 2020 23:02:31 EDT Ventricular Rate:  133 PR Interval:    QRS Duration: 87 QT Interval:  321 QTC Calculation: 478 R Axis:   37 Text Interpretation: Atrial fibrillation Borderline  repolarization abnormality When compared with ecg dated 04/05/2017, atrial fibrillation has replaced sinus rhythm Confirmed by Geoffery Lyons ([id number]) on 07/25/2020 11:05:58 PM   Radiology DG Chest Portable 1 View  Result Date: 07/25/2020 CLINICAL DATA:  Dyspnea and palpitations. Lightheaded and chest discomfort. EXAM: PORTABLE CHEST 1 VIEW COMPARISON:  None. FINDINGS: Normal heart size and pulmonary vascularity. Lungs are clear. No pleural effusions. No pneumothorax. Mediastinal contours appear intact. Degenerative changes in the spine. IMPRESSION: No active disease. Electronically Signed   By: Burman Nieves M.D.   On: 07/25/2020 23:44    Procedures .Critical Care Performed by: Cherly Anderson, PA-C Authorized by: Cherly Anderson, PA-C    CRITICAL CARE Performed by: Harvie Heck   Total critical care time: 42 minutes  Critical care time was exclusive of separately billable procedures and treating other patients.  Critical care was necessary to treat or prevent imminent or life-threatening deterioration.  Critical care was time spent personally by me on the following activities: development of treatment plan with patient and/or surrogate as well as nursing, discussions with consultants, evaluation of patient's response to treatment, examination of patient, obtaining history from patient or surrogate, ordering and performing treatments and interventions, ordering and review of laboratory studies, ordering and review of radiographic studies, pulse oximetry and re-evaluation of patient's condition.   (including critical care time)  Medications Ordered in ED Medications    diltiazem (CARDIZEM) 1 mg/mL load via infusion 15 mg (15 mg Intravenous Bolus from Bag 07/26/20 0006)    And  diltiazem (CARDIZEM) 125 mg in dextrose 5% 125 mL (1 mg/mL) infusion (5 mg/hr Intravenous New Bag/Given 07/26/20 0005)    ED Course  I have reviewed the triage vital signs and the nursing notes.  Pertinent labs & imaging results that were available during my care of the patient were reviewed by me and considered in my medical decision making (see chart for details).  Kristi Duncan was evaluated in Emergency Department on 07/26/2020 for the symptoms described in the history of present illness. He/she was evaluated in the context of the global COVID-19 pandemic, which necessitated consideration that the patient might be at risk for infection with the SARS-CoV-2 virus that causes COVID-19. Institutional protocols and algorithms that pertain to the evaluation of patients at risk for COVID-19 are in a state of rapid change based on information released by regulatory bodies including the CDC and federal and state organizations. These policies and algorithms were followed during the patient's care in the ED.    MDM Rules/Calculators/A&P                         . CHA2DS2-VASc Score 4- not currently anticoagulated as she did not tolerate this previously.  Patient presents to the ED with complaints of palpitations.  In afib w/ RVR @ rate in the 130s on my assessment.  Patient w/ history of afib, not anticoagulated, and not currently hemodynamically unstable therefore do not feel she is appropriate for cardioversion at this time. Will start cardizem bolus & drip. Basic labs & CXR ordered.   Additional history obtained:  Additional history obtained from EMS. Previous records obtained and reviewed.   Lab Tests:  I Ordered, reviewed, and interpreted labs, which included:  CBC: No anemia.  BMP: Elevated creatinine- mildly worsened from prior. No significant electrolyte derangement.  TSH:  WNL COVID: Pending.   Imaging Studies ordered:  I ordered imaging studies which included CXR, I independently visualized and interpreted  imaging which showed No active disease  Received cardizem bolus, currently on infusion @ rate of 10 mg/hr, HR improved to 110. Will discuss w/ hospitalist service for admission. Patient & her daughter updated on results & plan of care & are in agreement.   Findings and plan of care discussed with supervising physician Dr. Judd Lien who is in agreement.   1:43: CONSULT: Discussed case with hospitalist Dr. Toniann Fail who accepts admission.   Portions of this note were generated with Scientist, clinical (histocompatibility and immunogenetics). Dictation errors may occur despite best attempts at proofreading.   Final Clinical Impression(s) / ED Diagnoses Final diagnoses:  Atrial fibrillation with RVR [facility])    Rx / DC Orders ED Discharge Orders    None       Cherly Anderson, PA-C 07/26/20 0239    Geoffery Lyons, MD 07/26/20 2330

## 2020-07-26 ENCOUNTER — Encounter (HOSPITAL_COMMUNITY): Payer: Self-pay | Admitting: Internal Medicine

## 2020-07-26 ENCOUNTER — Inpatient Hospital Stay (HOSPITAL_COMMUNITY): Payer: BLUE CROSS/BLUE SHIELD

## 2020-07-26 DIAGNOSIS — Z79899 Other long term (current) drug therapy: Secondary | ICD-10-CM | POA: Diagnosis not present

## 2020-07-26 DIAGNOSIS — I4891 Unspecified atrial fibrillation: Secondary | ICD-10-CM

## 2020-07-26 DIAGNOSIS — E785 Hyperlipidemia, unspecified: Secondary | ICD-10-CM | POA: Diagnosis present

## 2020-07-26 DIAGNOSIS — M5441 Lumbago with sciatica, right side: Secondary | ICD-10-CM

## 2020-07-26 DIAGNOSIS — R7303 Prediabetes: Secondary | ICD-10-CM | POA: Diagnosis present

## 2020-07-26 DIAGNOSIS — E039 Hypothyroidism, unspecified: Secondary | ICD-10-CM | POA: Diagnosis present

## 2020-07-26 DIAGNOSIS — Z8673 Personal history of transient ischemic attack (TIA), and cerebral infarction without residual deficits: Secondary | ICD-10-CM | POA: Diagnosis not present

## 2020-07-26 DIAGNOSIS — I480 Paroxysmal atrial fibrillation: Secondary | ICD-10-CM | POA: Diagnosis present

## 2020-07-26 DIAGNOSIS — N179 Acute kidney failure, unspecified: Secondary | ICD-10-CM | POA: Diagnosis present

## 2020-07-26 DIAGNOSIS — I10 Essential (primary) hypertension: Secondary | ICD-10-CM

## 2020-07-26 DIAGNOSIS — G8929 Other chronic pain: Secondary | ICD-10-CM

## 2020-07-26 DIAGNOSIS — M549 Dorsalgia, unspecified: Secondary | ICD-10-CM | POA: Diagnosis present

## 2020-07-26 DIAGNOSIS — Z9049 Acquired absence of other specified parts of digestive tract: Secondary | ICD-10-CM | POA: Diagnosis not present

## 2020-07-26 DIAGNOSIS — Z90710 Acquired absence of both cervix and uterus: Secondary | ICD-10-CM | POA: Diagnosis not present

## 2020-07-26 DIAGNOSIS — F419 Anxiety disorder, unspecified: Secondary | ICD-10-CM | POA: Diagnosis present

## 2020-07-26 DIAGNOSIS — M5442 Lumbago with sciatica, left side: Secondary | ICD-10-CM

## 2020-07-26 DIAGNOSIS — Z20822 Contact with and (suspected) exposure to covid-19: Secondary | ICD-10-CM | POA: Diagnosis present

## 2020-07-26 DIAGNOSIS — Z87891 Personal history of nicotine dependence: Secondary | ICD-10-CM | POA: Diagnosis not present

## 2020-07-26 LAB — CBC
HCT: 38.9 % (ref 36.0–46.0)
Hemoglobin: 12.9 g/dL (ref 12.0–15.0)
MCH: 28.8 pg (ref 26.0–34.0)
MCHC: 33.2 g/dL (ref 30.0–36.0)
MCV: 86.8 fL (ref 80.0–100.0)
Platelets: 283 10*3/uL (ref 150–400)
RBC: 4.48 MIL/uL (ref 3.87–5.11)
RDW: 13.6 % (ref 11.5–15.5)
WBC: 7.4 10*3/uL (ref 4.0–10.5)
nRBC: 0 % (ref 0.0–0.2)

## 2020-07-26 LAB — BASIC METABOLIC PANEL
Anion gap: 10 (ref 5–15)
Anion gap: 10 (ref 5–15)
BUN: 17 mg/dL (ref 8–23)
BUN: 15 mg/dL (ref 8–23)
CO2: 23 mmol/L (ref 22–32)
CO2: 24 mmol/L (ref 22–32)
Calcium: 9.3 mg/dL (ref 8.9–10.3)
Calcium: 9.4 mg/dL (ref 8.9–10.3)
Chloride: 108 mmol/L (ref 98–111)
Chloride: 108 mmol/L (ref 98–111)
Creatinine, Ser: 1.25 mg/dL — ABNORMAL HIGH (ref 0.44–1.00)
Creatinine, Ser: 1.18 mg/dL — ABNORMAL HIGH (ref 0.44–1.00)
GFR calc Af Amer: 54 mL/min — ABNORMAL LOW (ref >60)
GFR calc Af Amer: 58 mL/min — ABNORMAL LOW (ref >60)
GFR calc non Af Amer: 46 mL/min — ABNORMAL LOW (ref >60)
GFR calc non Af Amer: 50 mL/min — ABNORMAL LOW (ref >60)
Glucose, Bld: 168 mg/dL — ABNORMAL HIGH (ref 70–99)
Glucose, Bld: 128 mg/dL — ABNORMAL HIGH (ref 70–99)
Potassium: 3.9 mmol/L (ref 3.5–5.1)
Potassium: 4.2 mmol/L (ref 3.5–5.1)
Sodium: 141 mmol/L (ref 135–145)
Sodium: 142 mmol/L (ref 135–145)

## 2020-07-26 LAB — HEMOGLOBIN A1C
Hgb A1c MFr Bld: 6.3 % — ABNORMAL HIGH (ref 4.8–5.6)
Mean Plasma Glucose: 134.11 mg/dL

## 2020-07-26 LAB — SARS CORONAVIRUS 2 BY RT PCR (HOSPITAL ORDER, PERFORMED IN ~~LOC~~ HOSPITAL LAB): SARS Coronavirus 2: NEGATIVE

## 2020-07-26 LAB — ECHOCARDIOGRAM COMPLETE
Area-P 1/2: 1.74 cm2
Height: 67 in
S' Lateral: 2.7 cm
Weight: 4000 oz

## 2020-07-26 LAB — TSH: TSH: 4.058 u[IU]/mL (ref 0.350–4.500)

## 2020-07-26 LAB — GLUCOSE, CAPILLARY: Glucose-Capillary: 118 mg/dL — ABNORMAL HIGH (ref 70–99)

## 2020-07-26 LAB — D-DIMER, QUANTITATIVE: D-Dimer, Quant: 0.31 ug/mL-FEU (ref 0.00–0.50)

## 2020-07-26 LAB — TROPONIN I (HIGH SENSITIVITY): Troponin I (High Sensitivity): 10 ng/L (ref <18)

## 2020-07-26 LAB — HIV ANTIBODY (ROUTINE TESTING W REFLEX): HIV Screen 4th Generation wRfx: NONREACTIVE

## 2020-07-26 MED ORDER — ATENOLOL 25 MG PO TABS
25.0000 mg | ORAL_TABLET | Freq: Every day | ORAL | Status: DC
  Filled 2020-07-26: qty 1

## 2020-07-26 MED ORDER — ONDANSETRON HCL 4 MG PO TABS: 4.0000 mg | ORAL_TABLET | Freq: Four times a day (QID) | ORAL | Status: DC | PRN

## 2020-07-26 MED ORDER — GABAPENTIN 100 MG PO CAPS
100.0000 mg | ORAL_CAPSULE | Freq: Every day | ORAL | Status: DC
  Administered 2020-07-26: 100 mg via ORAL
  Filled 2020-07-26: qty 1

## 2020-07-26 MED ORDER — LIDOCAINE 5 % EX PTCH
1.0000 | MEDICATED_PATCH | Freq: Every day | CUTANEOUS | Status: DC
  Administered 2020-07-26 – 2020-07-27 (×2): 1 via TRANSDERMAL
  Filled 2020-07-26 (×2): qty 1

## 2020-07-26 MED ORDER — DIAZEPAM 2 MG PO TABS: 2.0000 mg | ORAL_TABLET | Freq: Four times a day (QID) | ORAL | Status: DC | PRN

## 2020-07-26 MED ORDER — CALCIUM CARBONATE 1250 (500 CA) MG PO TABS
1.0000 | ORAL_TABLET | Freq: Every day | ORAL | Status: DC
  Administered 2020-07-26 – 2020-07-27 (×2): 500 mg via ORAL
  Filled 2020-07-26 (×2): qty 1

## 2020-07-26 MED ORDER — ASPIRIN EC 81 MG PO TBEC
81.0000 mg | DELAYED_RELEASE_TABLET | Freq: Every day | ORAL | Status: DC
  Administered 2020-07-26 – 2020-07-27 (×2): 81 mg via ORAL
  Filled 2020-07-26 (×2): qty 1

## 2020-07-26 MED ORDER — HYPROMELLOSE (GONIOSCOPIC) 2.5 % OP SOLN
1.0000 [drp] | Freq: Every day | OPHTHALMIC | Status: DC | PRN
  Filled 2020-07-26: qty 15

## 2020-07-26 MED ORDER — METOPROLOL TARTRATE 25 MG PO TABS: 25.0000 mg | ORAL_TABLET | Freq: Two times a day (BID) | ORAL | Status: DC

## 2020-07-26 MED ORDER — ACETAMINOPHEN 325 MG PO TABS
650.0000 mg | ORAL_TABLET | Freq: Four times a day (QID) | ORAL | Status: DC | PRN
  Administered 2020-07-27 (×2): 650 mg via ORAL
  Filled 2020-07-26 (×2): qty 2

## 2020-07-26 MED ORDER — ONDANSETRON HCL 4 MG/2ML IJ SOLN: 4.0000 mg | Freq: Four times a day (QID) | INTRAMUSCULAR | Status: DC | PRN

## 2020-07-26 MED ORDER — DILTIAZEM HCL 60 MG PO TABS
60.0000 mg | ORAL_TABLET | Freq: Four times a day (QID) | ORAL | Status: DC
  Administered 2020-07-26 – 2020-07-27 (×3): 60 mg via ORAL
  Filled 2020-07-26 (×3): qty 1

## 2020-07-26 MED ORDER — AMLODIPINE BESYLATE 5 MG PO TABS
5.0000 mg | ORAL_TABLET | Freq: Every day | ORAL | Status: DC
  Filled 2020-07-26: qty 1

## 2020-07-26 MED ORDER — ADULT MULTIVITAMIN W/MINERALS CH
1.0000 | ORAL_TABLET | Freq: Every day | ORAL | Status: DC
  Administered 2020-07-26 – 2020-07-27 (×2): 1 via ORAL
  Filled 2020-07-26 (×2): qty 1

## 2020-07-26 MED ORDER — LORATADINE 10 MG PO TABS
10.0000 mg | ORAL_TABLET | Freq: Every day | ORAL | Status: DC
  Administered 2020-07-26 – 2020-07-27 (×2): 10 mg via ORAL
  Filled 2020-07-26 (×2): qty 1

## 2020-07-26 MED ORDER — TRAMADOL HCL 50 MG PO TABS: 50.0000 mg | ORAL_TABLET | Freq: Four times a day (QID) | ORAL | Status: DC | PRN

## 2020-07-26 MED ORDER — METOPROLOL TARTRATE 25 MG PO TABS
25.0000 mg | ORAL_TABLET | Freq: Two times a day (BID) | ORAL | Status: DC
  Filled 2020-07-26: qty 1

## 2020-07-26 NOTE — Progress Notes (Signed)
PROGRESS NOTE    EQUILLA QUE  WHQ:[id number] DOB: 1961/08/26 DOA: 07/25/2020 PCP: Virgilio Belling, PA-C    Brief Narrative:  Patient admitted to the hospital with a working diagnosis of atrial fibrillation with rapid ventricular response.  59 year old female with past medical history for paroxysmal atrial fibrillation, history of CVA, hypertension and chronic back pain.  Patient reported persistent palpitations for about 6 hours.  No chest pain or dyspnea.  Her symptoms were refractive to oral diltiazem 120 mg.  On her initial physical examination patient was noted to be tachycardic, in atrial fibrillation with rapid ventricular response.  Patient was placed on diltiazem infusion, her blood pressure 123/70, heart rate down to 93, respiratory rate 14, oxygen saturation 97%.  Her lungs were clear to auscultation bilaterally, heart S1-S2, irregularly irregular, abdomen soft, no lower extremity edema.  Sodium 141, potassium 3.9, chloride 108, bicarb 23, glucose 168, BUN 17, creatinine 1.25, white count 7.7, hemoglobin 12.0, hematocrit 37.0, platelets 255.  SARS COVID-19 negative.  Chest radiograph with bilateral hilar vascular congestion.  EKG 133 bpm, normal axis, normal QTC, narrow complex QRS, atrial fibrillation rhythm, no significant ST segment or T wave changes.  Assessment & Plan:   Principal Problem:   Atrial fibrillation with RVR (HCC) Active Problems:   Hypertension   Atrial fibrillation (HCC)   Chronic back pain   1. Paroxysmal atrial fibrillation, with RVR. Patient has been placed on diltiazem infusion, rate this am up to 10 mg/ Hr, with improve in HR to 58 yo 78 at rest, telemetry personally reviewed, continue with atrial fibrillation rhythm.   Will change atenolol to metoprolol 25 mg po bid and wean off diltiazem to HR less than 130 bpm. No clinical signs of volume overload.  Echocardiogram from 2018, with preserved LV systolic function. Will plan to recheck echocardiogram  on this admission.   Patient not on anticoagulation due to history of intracranial bleed. Continue with low dose aspirin.   2. AKI. Renal function with serum cr down to 1,18 with K at 4,2 and serum bicarbonate at 24. No clinical signs of volume overload. Keep K at 4 and Mg at 2,  Follow up on renal function and electrolytes in am.   3, HTN. Stable blood pressure with systolic this am 659 mmHg. Continue rate control of atrial fibrillation.   4, Chronic back pain. Controlled back pain with lidocaine and gabapentin. Resume tramadol.   5. Hx of CVA. Continue with aspirin for antiplatelet therapy.   6. Anxiety. Resume diazepam as needed for anxiety.   Patient continue to be at high risk for worsening atrial fibrillation.   Status is: Observation  The patient will require care spanning > 2 midnights and should be moved to inpatient because: IV treatments appropriate due to intensity of illness or inability to take PO, patient continue atrial fibrillation requiring IV diltiazem to maintain rate control.   Dispo: The patient is from: Home              Anticipated d/c is to: Home              Anticipated d/c date is: 2 days              Patient currently is not medically stable to d/c.    DVT prophylaxis: scd   Code Status:    full  Family Communication:  I spoke over the phone with the patient's daughter about patient's  condition, plan of care, prognosis and all questions were  addressed.     Consultants:   Cardiology    *    Subjective: Patient is feeling better, but not yet back to baseline, no dyspnea or chest pain, no nausea or vomiting.   Objective: Vitals:   07/26/20 0300 07/26/20 0315 07/26/20 0330 07/26/20 0430  BP: 118/65 118/71 (!) 106/57 (!) 104/53  Pulse: 93 (!) 39 68 64  Resp: 14 18 18    Temp:    97.8 F (36.6 C)  TempSrc:    Oral  SpO2: 100% 100% 99% 100%  Weight:      Height:        Intake/Output Summary (Last 24 hours) at 07/26/2020 0849 Last data  filed at 07/25/2020 2300 Gross per 24 hour  Intake 500 ml  Output --  Net 500 ml   Filed Weights   07/25/20 2303  Weight: 113.4 kg    Examination:   General: Not in pain or dyspnea, deconditioned  Neurology: Awake and alert, non focal  E ENT: mild pallor, no icterus, oral mucosa moist Cardiovascular: No JVD. S1-S2 present, irregularly irregular, no gallops, rubs, or murmurs. No lower extremity edema. Pulmonary: positive breath sounds bilaterally, adequate air movement, no wheezing, rhonchi or rales. Gastrointestinal. Abdomen soft and non tender Skin. No rashes Musculoskeletal: no joint deformities     Data Reviewed: I have personally reviewed following labs and imaging studies  CBC: Recent Labs  Lab 07/25/20 2315 07/26/20 0452  WBC 7.7 7.4  HGB 12.0 12.9  HCT 37.0 38.9  MCV 87.9 86.8  PLT 255 283   Basic Metabolic Panel: Recent Labs  Lab 07/25/20 2315 07/26/20 0452  NA 141 142  K 3.9 4.2  CL 108 108  CO2 23 24  GLUCOSE 168* 128*  BUN 17 15  CREATININE 1.25* 1.18*  CALCIUM 9.3 9.4   GFR: Estimated Creatinine Clearance: 65 mL/min (A) (by C-G formula based on SCr of 1.18 mg/dL (H)). Liver Function Tests: No results for input(s): AST, ALT, ALKPHOS, BILITOT, PROT, ALBUMIN in the last 168 hours. No results for input(s): LIPASE, AMYLASE in the last 168 hours. No results for input(s): AMMONIA in the last 168 hours. Coagulation Profile: No results for input(s): INR, PROTIME in the last 168 hours. Cardiac Enzymes: No results for input(s): CKTOTAL, CKMB, CKMBINDEX, TROPONINI in the last 168 hours. BNP (last 3 results) No results for input(s): PROBNP in the last 8760 hours. HbA1C: Recent Labs    07/26/20 0452  HGBA1C 6.3*   CBG: No results for input(s): GLUCAP in the last 168 hours. Lipid Profile: No results for input(s): CHOL, HDL, LDLCALC, TRIG, CHOLHDL, LDLDIRECT in the last 72 hours. Thyroid Function Tests: Recent Labs    07/25/20 2315  TSH 4.058    Anemia Panel: No results for input(s): VITAMINB12, FOLATE, FERRITIN, TIBC, IRON, RETICCTPCT in the last 72 hours.    Radiology Studies: I have reviewed all of the imaging during this hospital visit personally     Scheduled Meds: . aspirin EC  81 mg Oral Daily  . atenolol  25 mg Oral Daily  . gabapentin  100 mg Oral QHS  . lidocaine  1 patch Transdermal Q0600   Continuous Infusions: . diltiazem (CARDIZEM) infusion 10 mg/hr (07/26/20 0128)     LOS: 0 days        Jamonta Goerner 09/25/20, MD

## 2020-07-26 NOTE — Progress Notes (Signed)
  Echocardiogram 2D Echocardiogram has been performed.  Janalyn Harder 07/26/2020, 3:57 PM

## 2020-07-26 NOTE — H&P (Signed)
History and Physical    Kristi Duncan JJH:[id number] DOB: 1960-11-19 DOA: 07/25/2020  PCP: Virgilio Belling, PA-C  Patient coming from: Home.  Chief Complaint: Palpitations.  HPI: Kristi Duncan is a 59 y.o. female with history of paroxysmal atrial fibrillation not on anticoagulation with history of CVA bleed, hypertension chronic back pain presents to the ER patient had persistent palpitation after last night dinner.  Denies any chest pain had mild shortness of breath.  Patient has as needed Cardizem 120 mg which she took despite which she had palpitation persistent and EMS brought patient to the ER.  ED Course: In the ER patient was still in A. fib with RVR and was given Cardizem bolus followed by infusion started and admitted for further observation.  Chest x-ray was unremarkable TSH was 4.058 blood glucose 168 creatinine 1.25 Covid test was negative.  Patient admitted for further management of A. fib with RVR.  Review of Systems: As per HPI, rest all negative.   Past Medical History:  Diagnosis Date   A-fib [facility]    Chronic back pain    Hyperlipidemia    Hypertension    Thyroid disease     Past Surgical History:  Procedure Laterality Date   ABDOMINAL HYSTERECTOMY  2008   CHOLECYSTECTOMY  1992     reports that she quit smoking about 28 years ago. Her smoking use included cigarettes. She has never used smokeless tobacco. She reports current alcohol use. She reports that she does not use drugs.  Allergies  Allergen Reactions   Eliquis [Apixaban] Other (See Comments)    Bleeding   Iodine Hives   Pravastatin Other (See Comments)    Severe abdominal pain and leg cramping   Butorphanol     Other reaction(s): Other (See Comments)   Celecoxib Nausea Only   Codeine Nausea And Vomiting   Hctz [Hydrochlorothiazide] Other (See Comments)    Severe cramping    Hydrocodone Other (See Comments)    Severe back and heart rate    Iodinated Diagnostic Agents  Hives   Prednisone Other (See Comments)    Shaky, "wasn't feeling herself"   Simvastatin Other (See Comments)    Severe cramping    Vitamin D Analogs Other (See Comments)    Severe pain from head to toe    Xarelto [Rivaroxaban] Other (See Comments)    Severe bleeding    Nsaids Hives    Family History  Problem Relation Age of Onset   Heart disease Maternal Grandmother    Stroke Maternal Grandfather     Prior to Admission medications   Medication Sig Start Date End Date Taking? Authorizing Provider  acetaminophen (TYLENOL) 500 MG tablet Take 500 mg by mouth every 6 (six) hours as needed for mild pain, moderate pain, fever or headache.    [provider]  amLODipine (NORVASC) 5 MG tablet Take 1 tablet (5 mg total) by mouth daily. 03/10/19   Camnitz, Will Daphine Deutscher, MD  atenolol (TENORMIN) 25 MG tablet Take 1 tablet by mouth once daily 06/04/19   Ellwood Dense, MD  diltiazem (CARDIZEM) 120 MG tablet Take 1 tablet (120 mg total) by mouth 2 (two) times daily as needed (for fast heart rate). 03/10/19   Camnitz, Andree Coss, MD  gabapentin (NEURONTIN) 100 MG capsule Take 1 capsule (100 mg total) by mouth at bedtime. 06/14/19   Ellwood Dense, MD  HYDROmorphone (DILAUDID) 2 MG tablet Take 1 tablet (2 mg total) by mouth every 6 (six) hours as needed  for severe pain. Patient not taking: Reported on 06/14/2019 05/12/17   MayoAllyn Kenner, MD  lidocaine (LIDODERM) 5 % Place 1 patch onto the skin daily. Remove & Discard patch within 12 hours or as directed by MD 06/14/19   Ellwood Dense, MD    Physical Exam: Constitutional: Moderately built and nourished. Vitals:   07/26/20 0245 07/26/20 0300 07/26/20 0315 07/26/20 0330  BP: 123/70 118/65 118/71 (!) 106/57  Pulse: (!) 56 93 (!) 39 68  Resp:  14 18 18   Temp:      TempSrc:      SpO2: 97% 100% 100% 99%  Weight:      Height:       Eyes: Anicteric no pallor. ENMT: No discharge from the ears eyes nose or mouth. Neck: No mass felt.   No neck rigidity. Respiratory: No rhonchi or crepitations. Cardiovascular: S1-S2 heard. Abdomen: Soft nontender bowel sounds present. Musculoskeletal: No edema. Skin: No rash. Neurologic: Alert awake oriented to time place and person.  Moves all extremities 5 x 5. Psychiatric: Appears normal.  Normal affect.   Labs on Admission: I have personally reviewed following labs and imaging studies  CBC: Recent Labs  Lab 07/25/20 2315  WBC 7.7  HGB 12.0  HCT 37.0  MCV 87.9  PLT 255   Basic Metabolic Panel: Recent Labs  Lab 07/25/20 2315  NA 141  K 3.9  CL 108  CO2 23  GLUCOSE 168*  BUN 17  CREATININE 1.25*  CALCIUM 9.3   GFR: Estimated Creatinine Clearance: 61.4 mL/min (A) (by C-G formula based on SCr of 1.25 mg/dL (H)). Liver Function Tests: No results for input(s): AST, ALT, ALKPHOS, BILITOT, PROT, ALBUMIN in the last 168 hours. No results for input(s): LIPASE, AMYLASE in the last 168 hours. No results for input(s): AMMONIA in the last 168 hours. Coagulation Profile: No results for input(s): INR, PROTIME in the last 168 hours. Cardiac Enzymes: No results for input(s): CKTOTAL, CKMB, CKMBINDEX, TROPONINI in the last 168 hours. BNP (last 3 results) No results for input(s): PROBNP in the last 8760 hours. HbA1C: No results for input(s): HGBA1C in the last 72 hours. CBG: No results for input(s): GLUCAP in the last 168 hours. Lipid Profile: No results for input(s): CHOL, HDL, LDLCALC, TRIG, CHOLHDL, LDLDIRECT in the last 72 hours. Thyroid Function Tests: Recent Labs    07/25/20 2315  TSH 4.058   Anemia Panel: No results for input(s): VITAMINB12, FOLATE, FERRITIN, TIBC, IRON, RETICCTPCT in the last 72 hours. Urine analysis:    Component Value Date/Time   COLORURINE YELLOW 02/20/2016 0927   APPEARANCEUR CLEAR 02/20/2016 0927   LABSPEC 1.012 02/20/2016 0927   PHURINE 6.0 02/20/2016 0927   GLUCOSEU NEGATIVE 02/20/2016 0927   HGBUR NEGATIVE 02/20/2016 0927    BILIRUBINUR NEGATIVE 02/20/2016 0927   KETONESUR NEGATIVE 02/20/2016 0927   PROTEINUR NEGATIVE 02/20/2016 0927   NITRITE NEGATIVE 02/20/2016 0927   LEUKOCYTESUR MODERATE (A) 02/20/2016 0927   Sepsis Labs: @LABRCNTIP (procalcitonin:4,lacticidven:4) ) Recent Results (from the past 240 hour(s))  SARS Coronavirus 2 by RT PCR (hospital order, performed in [facility] hospital lab) Nasopharyngeal Nasopharyngeal Swab     Status: None   Collection Time: 07/26/20  1:13 AM   Specimen: Nasopharyngeal Swab  Result Value Ref Range Status   SARS Coronavirus 2 NEGATIVE NEGATIVE Final    Comment: (NOTE) SARS-CoV-2 target nucleic acids are NOT DETECTED.  The SARS-CoV-2 RNA is generally detectable in upper and lower respiratory specimens during the acute phase of infection. The  lowest concentration of SARS-CoV-2 viral copies this assay can detect is 250 copies / mL. A negative result does not preclude SARS-CoV-2 infection and should not be used as the sole basis for treatment or other patient management decisions.  A negative result may occur with improper specimen collection / handling, submission of specimen other than nasopharyngeal swab, presence of viral mutation(s) within the areas targeted by this assay, and inadequate number of viral copies (<250 copies / mL). A negative result must be combined with clinical observations, patient history, and epidemiological information.  Fact Sheet for Patients:   BoilerBrush.com.cy  Fact Sheet for Healthcare Providers: https://pope.com/  This test is not yet approved or  cleared by the [place] FDA and has been authorized for detection and/or diagnosis of SARS-CoV-2 by FDA under an Emergency Use Authorization (EUA).  This EUA will remain in effect (meaning this test can be used) for the duration of the COVID-19 declaration under Section 564(b)(1) of the Act, 21 U.S.C. section 360bbb-3(b)(1), unless  the authorization is terminated or revoked sooner.  Performed at [facility], [address], [place], [place] [address]      Radiological Exams on Admission: DG Chest Portable 1 View  Result Date: 07/25/2020 CLINICAL DATA:  Dyspnea and palpitations. Lightheaded and chest discomfort. EXAM: PORTABLE CHEST 1 VIEW COMPARISON:  None. FINDINGS: Normal heart size and pulmonary vascularity. Lungs are clear. No pleural effusions. No pneumothorax. Mediastinal contours appear intact. Degenerative changes in the spine. IMPRESSION: No active disease. Electronically Signed   By: Burman Nieves M.D.   On: 07/25/2020 23:44    EKG: Independently reviewed.  A. fib with RVR.  Assessment/Plan Principal Problem:   Atrial fibrillation with RVR (HCC) Active Problems:   Hypertension   Atrial fibrillation (HCC)   Chronic back pain    1. A. fib with RVR -presently on Cardizem infusion.  Patient is intolerant to anticoagulation since patient had severe bleeding.  Patient already takes a baby aspirin.  Will check cardiac markers D-dimer and patient is on albuterol which she only has taken last night.  We will try to increase the dose of atenolol if Cardizem does not bring the rate down. 2. Acute renal failure cause not clear UA is pending.  Follow metabolic panel. 3. Hypertension on atenolol and amlodipine.  Closely on Cardizem infusion. 4. Chronic back pain on lidocaine patch gabapentin and tramadol.   DVT prophylaxis: SCDs and aspirin.  Patient states she is intolerant to oral anticoagulation due to severe bleeding. Code Status: Full code. Family Communication: Discussed with patient. Disposition Plan: Home. Consults called: Cardiology. Admission status: Observation.   Eduard Clos MD [facility] Pager [phone].  If 7PM-7AM, please contact night-coverage [email or web address] Password [facility]  07/26/2020, 3:51 AM

## 2020-07-26 NOTE — Consult Note (Addendum)
Cardiology Consultation:   Patient ID: Kristi Duncan MRN: [id number]; DOB: [birth year]  Admit date: 07/25/2020 Date of Consult: 07/26/2020  Primary Care Provider: Chyrel Masson [facility] Cardiologist: Will Jorja Loa, MD previously / more recently Dr. 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 Toniann Fail.  History of Present Illness:   Ms. Duren has a history of atrial fibrillation going back several years. She was remotely on anticoagulation but she discontinued due to symptoms of GI/vaginal bleeding, mouth bleeding, and easy bruising on both Eliquis and Xarelto. She reports she did not require hospitalization and workup for such at the time this occurred. She is not interested in revisiting anticoagulation due to history of medication sensitivities. She previously saw Dr. Elberta Fortis, last in 02/2019. More recently she saw Dr. Sharol Roussel with [facility] for chest pain. A 2D echo 04/25/20 showed normal LVEF 60-65%, no significant valvular disease, normal LA size. She reports having a Zio patch showing beats from the top and bottom of her heart, report not available. A nuclear stress test was ordered but she was concerned for potential side effects of Lexiscan so opted not to have the study. In general she has not had significant chest pain since. She gets occasional chest fullness after eating but no exertional anginal symptoms although activity is limited by her osteoarthritis issues. She reports her atrial fib is usually infrequent - states she has frequent skipping at baseline but the actual episodes of sustained afib are rare. Her last episode was around May when she got the news of her youngest brother passing suddenly at  age 43. His death was ruled possible MI, no autopsy done. She had to take diltiazem at that time which she has for PRN use (120mg  BID). She is also on atenolol 25mg  daily. She states she had been unable to be on both in the past due to baseline bradycardia with HRs in the 40s-50s. Usually it runs in the 60s when in normal rhythm per her report. She also reports majority of BPs have been running great lately, <120 systolic. Last night she was resting and developed recurrent palpitations, mild SOB and the chest fullness that she typically gets with her atrial fib. She took 120mg  of diltiazem without relief and called EMS. In the ED she was found to be in atrial fib RVR with HR in the 130s. She was given diltiazem bolus and drip with improvement in HR to the 70s-low 100s. She thinks this episode too was precipitated by emotional stress. She has not had a prior sleep study but reports concern for OSA due to snoring and daytime fatigue.  Labs reveal normal CBC, normal troponin, hyperglycemia with A1C 6.3 indicative of pre-diabetes, normal TSH, and mild AKI with Cr 1.25 (previous value 0.98), Covid negative, CXR NAD   Past Medical History:  Diagnosis Date  . Chronic back pain   . Hyperlipidemia   . Hypertension   . Hypothyroidism   . Mild carotid artery disease (HCC) 2018  . Obesity   . Osteoarthritis   . PAF (paroxysmal atrial fibrillation) (HCC)   . TIA (transient ischemic attack)     Past Surgical History:  Procedure Laterality Date  . ABDOMINAL HYSTERECTOMY  2008  . CHOLECYSTECTOMY  1992  Home Medications:  Prior to Admission medications   Medication Sig Start Date End Date Taking? Authorizing Provider  acetaminophen (TYLENOL) 500 MG tablet Take 500 mg by mouth every 6 (six) hours as needed for mild pain, moderate pain, fever or headache.    [provider]  amLODipine (NORVASC) 5 MG tablet Take 1 tablet (5 mg total) by mouth daily. 03/10/19   Camnitz, Will Daphine Deutscher, MD  atenolol  (TENORMIN) 25 MG tablet Take 1 tablet by mouth once daily 06/04/19   Ellwood Dense, MD  diltiazem (CARDIZEM) 120 MG tablet Take 1 tablet (120 mg total) by mouth 2 (two) times daily as needed (for fast heart rate). 03/10/19   Camnitz, Andree Coss, MD  gabapentin (NEURONTIN) 100 MG capsule Take 1 capsule (100 mg total) by mouth at bedtime. 06/14/19   Ellwood Dense, MD  HYDROmorphone (DILAUDID) 2 MG tablet Take 1 tablet (2 mg total) by mouth every 6 (six) hours as needed for severe pain. Patient not taking: Reported on 06/14/2019 05/12/17   MayoAllyn Kenner, MD  lidocaine (LIDODERM) 5 % Place 1 patch onto the skin daily. Remove & Discard patch within 12 hours or as directed by MD 06/14/19   Ellwood Dense, MD    Inpatient Medications: Scheduled Meds: . aspirin EC  81 mg Oral Daily  . gabapentin  100 mg Oral QHS  . lidocaine  1 patch Transdermal Q0600  . metoprolol tartrate  25 mg Oral BID   Continuous Infusions: . diltiazem (CARDIZEM) infusion 10 mg/hr (07/26/20 0128)   PRN Meds: ondansetron **OR** ondansetron (ZOFRAN) IV  Allergies:    Allergies  Allergen Reactions  . Eliquis [Apixaban] Other (See Comments)    Bleeding  . Iodine Hives  . Pravastatin Other (See Comments)    Severe abdominal pain and leg cramping  . Butorphanol     Other reaction(s): Other (See Comments)  . Celecoxib Nausea Only  . Codeine Nausea And Vomiting  . Hctz [Hydrochlorothiazide] Other (See Comments)    Severe cramping   . Hydrocodone Other (See Comments)    Severe back and heart rate   . Iodinated Diagnostic Agents Hives  . Prednisone Other (See Comments)    Shaky, "wasn't feeling herself"  . Simvastatin Other (See Comments)    Severe cramping   . Vitamin D Analogs Other (See Comments)    Severe pain from head to toe   . Xarelto [Rivaroxaban] Other (See Comments)    Severe bleeding   . Nsaids Hives    Social History:   Social History   Socioeconomic History  . Marital status: Divorced     Spouse name: Not on file  . Number of children: 4  . Years of education: 10  . Highest education level: Not on file  Occupational History  . Occupation: [employer]  Tobacco Use  . Smoking status: Former Smoker    Types: Cigarettes    Quit date: 11/19/1991    Years since quitting: 28.7  . Smokeless tobacco: Never Used  Vaping Use  . Vaping Use: Never used  Substance and Sexual Activity  . Alcohol use: Yes    Comment: occasional   . Drug use: No  . Sexual activity: Not Currently  Other Topics Concern  . Not on file  Social History Narrative   Lives at home w/ her daughter   Right-handed   Caffeine: 1 cup coffee per day   Social Determinants of Health   Financial Resource Strain:   . Difficulty of Paying Living  Expenses: Not on file  Food Insecurity:   . Worried About Programme researcher, broadcasting/film/video in the Last Year: Not on file  . Ran Out of Food in the Last Year: Not on file  Transportation Needs:   . Lack of Transportation (Medical): Not on file  . Lack of Transportation (Non-Medical): Not on file  Physical Activity:   . Days of Exercise per Week: Not on file  . Minutes of Exercise per Session: Not on file  Stress:   . Feeling of Stress : Not on file  Social Connections:   . Frequency of Communication with Friends and Family: Not on file  . Frequency of Social Gatherings with Friends and Family: Not on file  . Attends Religious Services: Not on file  . Active Member of Clubs or Organizations: Not on file  . Attends Banker Meetings: Not on file  . Marital Status: Not on file  Intimate Partner Violence:   . Fear of Current or Ex-Partner: Not on file  . Emotionally Abused: Not on file  . Physically Abused: Not on file  . Sexually Abused: Not on file    Family History:   Family History  Problem Relation Age of Onset  . Heart disease Maternal Grandmother   . Stroke Maternal Grandfather   . Heart disease Brother        Died of presumed MI age 30, no autopsy      ROS:  Please see the history of present illness.  All other ROS reviewed and negative.     Physical Exam/Data:   Vitals:   07/26/20 0300 07/26/20 0315 07/26/20 0330 07/26/20 0430  BP: 118/65 118/71 (!) 106/57 (!) 104/53  Pulse: 93 (!) 39 68 64  Resp: Temp:    97.8 F (36.6 C)  TempSrc:    Oral  SpO2: 100% 100% 99% 100%  Weight:      Height:        Intake/Output Summary (Last 24 hours) at 07/26/2020 0929 Last data filed at 07/25/2020 2300 Gross per 24 hour  Intake 500 ml  Output --  Net 500 ml   Last 3 Weights 07/25/2020 01/12/2018 01/01/2018  Weight (lbs) 250 lb 239 lb 240 lb 9.6 oz  Weight (kg) 113.399 kg 108.41 kg 109.135 kg     Body mass index is 39.16 kg/m.  Vital Signs. BP (!) 104/53 (BP Location: Right Arm)   Pulse 64   Temp 97.8 F (36.6 C) (Oral)   Resp 18   Ht  (1.702 m)   Wt 113.4 kg   SpO2 100%   BMI 39.16 kg/m  General: Well developed, well nourished AAF, in no acute distress Head: Normocephalic, atraumatic, sclera non-icteric, no xanthomas, nares are without discharge. Neck: Negative for carotid bruits. JVP not elevated. Lungs: Clear bilaterally to auscultation without wheezes, rales, or rhonchi. Breathing is unlabored. Heart: Irregularly irregular, S1 S2 without murmurs, rubs, or gallops.  Abdomen: Soft, non-tender, non-distended with normoactive bowel sounds. No rebound/guarding. Extremities: No clubbing or cyanosis. No edema. Distal pedal pulses are 2+ and equal bilaterally. Neuro: Alert and oriented X 3. Moves all extremities spontaneously. Psych:  Responds to questions appropriately with a normal affect.   EKG:  The EKG was personally reviewed and demonstrates:  Atrial fibrillation 133bpm, nonspecific TW changes, otherwise nonacute  Telemetry:  Telemetry was personally reviewed and demonstrates:  Atrial fib rates 70-100  Relevant CV Studies:  CareEverywhere Echo 04/25/20  SUMMARY  The left  ventricular size is normal. Mild left  ventricular  hypertrophy. Left ventricular systolic function is normal.  LV ejection fraction = 60-65%. The left ventricular wall motion is  normal.  The right ventricle is normal in size and function.  There is no pericardial effusion.  There is no significant valvular stenosis or regurgitation.  There is no comparison study available.    2D Echo 03/2017 Study Conclusions   - Left ventricle: The cavity size was normal. Wall thickness was  increased in a pattern of mild LVH. Systolic function was normal.  The estimated ejection fraction was in the range of 60% to 65%.  Wall motion was normal; there were no regional wall motion  abnormalities. Features are consistent with a pseudonormal left  ventricular filling pattern, with concomitant abnormal relaxation  and increased filling pressure (grade 2 diastolic dysfunction).  - Mitral valve: There was mild regurgitation.  - Left atrium: The atrium was at the upper limits of normal in  size.  - Right atrium: Central venous pressure (est): 3 mm Hg.  - Tricuspid valve: There was physiologic regurgitation.  - Pulmonary arteries: Systolic pressure could not be accurately  estimated.  - Pericardium, extracardiac: There was no pericardial effusion.   Impressions:   - Mild LVH with LVEF 60-65% with grade 2 diastolic dysfunction.  Upper normal left atrial size. Mild mitral regurgitation.   -------------------------------------------------------------------  Laboratory Data:  High Sensitivity Troponin:   Recent Labs  Lab 07/26/20 0452  TROPONINIHS 10     Chemistry Recent Labs  Lab 07/25/20 2315 07/26/20 0452  NA 141 142  K 3.9 4.2  CL 108 108  CO2 23 24  GLUCOSE 168* 128*  BUN 17 15  CREATININE 1.25* 1.18*  CALCIUM 9.3 9.4  GFRNONAA 46* 50*  GFRAA 54* 58*  ANIONGAP 10 10    No results for input(s): PROT, ALBUMIN, AST, ALT, ALKPHOS, BILITOT in the last 168 hours. Hematology Recent Labs  Lab  07/25/20 2315 07/26/20 0452  WBC 7.7 7.4  RBC 4.21 4.48  HGB 12.0 12.9  HCT 37.0 38.9  MCV 87.9 86.8  MCH 28.5 28.8  MCHC 32.4 33.2  RDW 13.5 13.6  PLT 255 283   BNPNo results for input(s): BNP, PROBNP in the last 168 hours.  DDimer  Recent Labs  Lab 07/26/20 0452  DDIMER 0.31     Radiology/Studies:  DG Chest Portable 1 View  Result Date: 07/25/2020 CLINICAL DATA:  Dyspnea and palpitations. Lightheaded and chest discomfort. EXAM: PORTABLE CHEST 1 VIEW COMPARISON:  None. FINDINGS: Normal heart size and pulmonary vascularity. Lungs are clear. No pleural effusions. No pneumothorax. Mediastinal contours appear intact. Degenerative changes in the spine. IMPRESSION: No active disease. Electronically Signed   By: Burman NievesWilliam  Stevens M.D.   On: 07/25/2020 23:44    Assessment and Plan:   1. Recurrent paroxysmal atrial fibrillation - patient reports infrequent episodes, mostly triggered by emotional stress - remains on atenolol 25mg  daily and diltiazem drip - reports baseline bradycardia in the past when in NSR - CHADSVASC is 5 for HTN, TIA(2 pts), vascular disease (mild carotid plaque) and female sex, however, patient remains firm in her decision to decline anticoagulants at this time and prefers aspirin only. She understands risk of stroke with this condition. - given that we cannot anticoagulate, rate control remains the primary strategy - will review plan with MD  2. Essential HTN - BP OK, hold amlodipine while on diltiazem pending further discussion with MD  3. Hyperlipidemia - 03/2020 LDL  152, previously declined statin therapy, f/u OP  4. Suspected OSA - patient reports snoring, daytime fatigue therefore would suggest sleep study arranged as outpatient especially given implications with afib  5. Mild AKI  - Cr 1.25 ->1.18 -> per IM  6. Pre-diabetes A1C 6.3 - will need OP monitoring by primary care  For questions or updates, please contact [facility] Please consult  [email or web address] for contact info under    Signed, Laurann Montana, PA-C  07/26/2020 9:29 AM As above, patient seen and examined.  Briefly she is a 59 year old female with past medical history of paroxysmal atrial fibrillation not on anticoagulation, hypertension, hyperlipidemia, prior TIA, mild carotid disease for evaluation of atrial fibrillation.  She is followed by Dr.Junagadhwalla.  Also seen by Dr. Elberta Fortis in the past.  Most recent echocardiogram June 2021 showed normal LV function.  Patient does have some dyspnea on exertion at baseline.  However she does not have exertional chest pain.  She has intermittent palpitations.  Last evening she developed palpitations that did not resolve.  She came to the emergency room and was found to be in atrial fibrillation.  She was admitted and cardiology asked to evaluate.  Hemoglobin 12.9.  Creatinine 1.18.  TSH 4.058.  Electrocardiogram shows atrial fibrillation with nonspecific ST changes.  1 paroxysmal atrial fibrillation-patient remains in atrial fibrillation this morning.  Her heart rate is controlled.  We will change Cardizem to p.o.  I will discontinue atenolol.  Hopefully we can control her heart rate with one medication.  Note TSH is normal.  Recent echocardiogram shows normal LV function. CHADSvasc 5.  However patient refuses anticoagulation as she states she has had difficulty with bleeding on both Xarelto and apixaban.  She understands the higher risk of CVA.  We will continue with aspirin 81 mg daily.  Hopefully patient will convert spontaneously.  We cannot proceed with cardioversion without anticoagulation and patient understands this.  If she does convert it would be worthwhile to consider an antiarrhythmic as an outpatient as her episodes appear to be coming more frequent.  Ablation could also be considered.  We will leave this to her primary cardiologist.  Also note she snores and I have recommended a sleep study as an outpatient.  2 hypertension-we  will try and consolidate patient's medications.  I would like to treat both her heart rate and her blood pressure with Cardizem.  We will discontinue beta-blocker and follow.  We will also not resume amlodipine.  Patient will need close follow-up with her cardiologist when she is discharged.  Olga Millers

## 2020-07-27 LAB — MAGNESIUM: Magnesium: 1.8 mg/dL (ref 1.7–2.4)

## 2020-07-27 LAB — BASIC METABOLIC PANEL
Anion gap: 9 (ref 5–15)
BUN: 16 mg/dL (ref 8–23)
CO2: 24 mmol/L (ref 22–32)
Calcium: 9 mg/dL (ref 8.9–10.3)
Chloride: 108 mmol/L (ref 98–111)
Creatinine, Ser: 1.15 mg/dL — ABNORMAL HIGH (ref 0.44–1.00)
GFR calc Af Amer: 59 mL/min — ABNORMAL LOW (ref >60)
GFR calc non Af Amer: 51 mL/min — ABNORMAL LOW (ref >60)
Glucose, Bld: 109 mg/dL — ABNORMAL HIGH (ref 70–99)
Potassium: 4 mmol/L (ref 3.5–5.1)
Sodium: 141 mmol/L (ref 135–145)

## 2020-07-27 MED ORDER — DILTIAZEM HCL ER COATED BEADS 120 MG PO CP24
240.0000 mg | ORAL_CAPSULE | Freq: Every day | ORAL | Status: DC
  Filled 2020-07-27: qty 2

## 2020-07-27 MED ORDER — DILTIAZEM HCL ER COATED BEADS 240 MG PO CP24: 240.0000 mg | ORAL_CAPSULE | Freq: Every day | ORAL | 0 refills | Status: AC

## 2020-07-27 NOTE — Discharge Summary (Signed)
Physician Discharge Summary  Kristi Duncan WUJ:[id number] DOB: October 18, 1961 DOA: 07/25/2020  PCP: Virgilio Belling, PA-C  Admit date: 07/25/2020 Discharge date: 07/27/2020  Admitted From: Home  Disposition:  Home   Recommendations for Outpatient Follow-up and new medication changes:  1. Follow up with Sherrie Mustache PA-C in 7 days. 2. Patient has been placed on diltiazem 240 mg daily.  3. She continue to decline anticoagulation.  4. Follow up with Cardiology at [facility] in 4 to 6 weeks.   Home Health: no   Equipment/Devices: no    Discharge Condition: stable  CODE STATUS: full  Diet recommendation: heart healthy   Brief/Interim Summary: Patient admitted to the hospital with a working diagnosis of atrial fibrillation with rapid ventricular response.  59 year old female with past medical history for paroxysmal atrial fibrillation, history of CVA, hypertension and chronic back pain.  Patient reported persistent palpitations for about 6 hours.  No chest pain or dyspnea.  Her symptoms were refractive to oral diltiazem 120 mg.  On her initial physical examination patient was noted to be tachycardic, in atrial fibrillation with rapid ventricular response.  Patient was placed on diltiazem infusion, her blood pressure 123/70, heart rate from 133 down to 93, respiratory rate 14, oxygen saturation 97%.  Her lungs were clear to auscultation bilaterally, heart S1-S2, irregularly irregular, abdomen soft, no lower extremity edema.  Sodium 141, potassium 3.9, chloride 108, bicarb 23, glucose 168, BUN 17, creatinine 1.25, white count 7.7, hemoglobin 12.0, hematocrit 37.0, platelets 255.  SARS COVID-19 negative.  Chest radiograph with bilateral hilar vascular congestion.  EKG 133 bpm, normal axis, normal QTC, narrow complex QRS, atrial fibrillation rhythm, no significant ST segment or T wave changes.  1.  Paroxysmal atrial fibrillation with rapid ventricular response.  Patient was admitted to the medical  ward, she was placed on a bedside telemetry monitoring.  Her heart rate improved with diltiazem, she was transitioned from IV infusion to oral formulation.  Her dose was adjusted 240 mg daily with good toleration.  Her heart rate has remained between 50 and 100 bpm.  She has converted to sinus rhythm.  Further work-up with echocardiography revealed LV ejection fraction of 60 to 65%, with no significant aortic valve disease.  She has been explained high risk of CVA and antithrombotic medication indicated, for now patient will like to continue with aspirin alone considering prior bleeding complications.   2.  Acute kidney injury.  With supportive medical therapy her kidney function improved.  At discharge her creatinine is 1.15, BUN 16.  3.  Hypertension.  Antihypertensive agents were discontinued.  Patient will continue diltiazem for heart rate and blood pressure control.  4. Chronic back pain. Continue pain control with acetaminophen and tramadol.   5. Hx of CVA/ hemorrhagic. Holding on statin therapy for now, patient will continue with aspirin.   6. Anxiety. Patient will continue with as needed diazepam.    Discharge Diagnoses:  Principal Problem:   Atrial fibrillation with RVR (HCC) Active Problems:   Hypertension   Atrial fibrillation (HCC)   Chronic back pain    Discharge Instructions   Allergies as of 07/27/2020      Reactions   Eliquis [apixaban] Other (See Comments)   Bleeding   Iodine Hives   Pravastatin Other (See Comments)   Severe abdominal pain and leg cramping   Butorphanol    Other reaction(s): Other (See Comments)   Celecoxib Nausea Only   Codeine Nausea And Vomiting   Hctz [hydrochlorothiazide] Other (See  Comments)   Severe cramping    Hydrocodone Other (See Comments)   Severe back and heart rate    Iodinated Diagnostic Agents Hives   Prednisone Other (See Comments)   Shaky, "wasn't feeling herself"   Simvastatin Other (See Comments)   Severe cramping     Vitamin D Analogs Other (See Comments)   Severe pain from head to toe    Xarelto [rivaroxaban] Other (See Comments)   Severe bleeding    Nsaids Hives   Tape Rash      Medication List    STOP taking these medications   amLODipine 5 MG tablet Commonly known as: NORVASC   atenolol 25 MG tablet Commonly known as: TENORMIN   diltiazem 120 MG tablet Commonly known as: CARDIZEM   HYDROmorphone 2 MG tablet Commonly known as: Dilaudid     TAKE these medications   acetaminophen 500 MG tablet Commonly known as: TYLENOL Take 500 mg by mouth every 6 (six) hours as needed for mild pain, moderate pain, fever or headache.   aspirin 81 MG chewable tablet Chew 81 mg by mouth daily.   Biofreeze 4 % Gel Generic drug: Menthol (Topical Analgesic) Apply 1 application topically daily as needed for pain.   calcium carbonate 1250 (500 Ca) MG chewable tablet Commonly known as: OS-CAL Chew 1-2 tablets by mouth daily as needed for indigestion.   diazepam 2 MG tablet Commonly known as: VALIUM Take 2 mg by mouth every 6 (six) hours as needed for anxiety.   diltiazem 240 MG 24 hr capsule Commonly known as: CARDIZEM CD Take 1 capsule (240 mg total) by mouth daily. Start taking on: July 28, 2020   gabapentin 100 MG capsule Commonly known as: NEURONTIN Take 1 capsule (100 mg total) by mouth at bedtime.   lidocaine 5 % Commonly known as: Lidoderm Place 1 patch onto the skin daily. Remove & Discard patch within 12 hours or as directed by MD What changed:   when to take this  reasons to take this  additional instructions   loratadine 10 MG tablet Commonly known as: CLARITIN Take 10 mg by mouth daily.   multivitamin capsule Take 1 capsule by mouth daily. Centrum Silver   Systane Balance 0.6 % Soln Generic drug: Propylene Glycol Apply 1 drop to eye daily as needed (dry eyes).   traMADol 50 MG tablet Commonly known as: ULTRAM Take 50 mg by mouth every 6 (six) hours as  needed for pain.       Allergies  Allergen Reactions  . Eliquis [Apixaban] Other (See Comments)    Bleeding  . Iodine Hives  . Pravastatin Other (See Comments)    Severe abdominal pain and leg cramping  . Butorphanol     Other reaction(s): Other (See Comments)  . Celecoxib Nausea Only  . Codeine Nausea And Vomiting  . Hctz [Hydrochlorothiazide] Other (See Comments)    Severe cramping   . Hydrocodone Other (See Comments)    Severe back and heart rate   . Iodinated Diagnostic Agents Hives  . Prednisone Other (See Comments)    Shaky, "wasn't feeling herself"  . Simvastatin Other (See Comments)    Severe cramping   . Vitamin D Analogs Other (See Comments)    Severe pain from head to toe   . Xarelto [Rivaroxaban] Other (See Comments)    Severe bleeding   . Nsaids Hives  . Tape Rash    Consultations:  Cardiology    Procedures/Studies: DG Chest Portable 1 View  Result  Date: 07/25/2020 CLINICAL DATA:  Dyspnea and palpitations. Lightheaded and chest discomfort. EXAM: PORTABLE CHEST 1 VIEW COMPARISON:  None. FINDINGS: Normal heart size and pulmonary vascularity. Lungs are clear. No pleural effusions. No pneumothorax. Mediastinal contours appear intact. Degenerative changes in the spine. IMPRESSION: No active disease. Electronically Signed   By: Burman Nieves M.D.   On: 07/25/2020 23:44   ECHOCARDIOGRAM COMPLETE  Result Date: 07/26/2020    ECHOCARDIOGRAM REPORT   Patient Name:   TRISTINA SAHAGIAN Date of Exam: 07/26/2020 Medical Rec #:  [id number]        Height:       67.0 in Accession #:    [id number]       Weight:       250.0 lb Date of Birth:  1961-09-25        BSA:          2.223 m Patient Age:    59 years         BP:           148/83 mmHg Patient Gender: F                HR:           52 bpm. Exam Location:  Inpatient Procedure: 2D Echo, Cardiac Doppler and Color Doppler Indications:    R06.02 SOB  History:        Patient has prior history of Echocardiogram examinations, most                  recent 04/06/2017. Abnormal ECG, TIA and Stroke,                 Arrythmias:Atrial Fibrillation; Risk Factors:Hypertension and                 Dyslipidemia.  Sonographer:    Sheralyn Boatman RDCS Referring Phys: [id number] [facility] Abdishakur Gottschall  Sonographer Comments: Technically difficult study due to poor echo windows and patient is morbidly obese. Image acquisition challenging due to patient body habitus and Image acquisition challenging due to respiratory motion. Machine malfunction during exam. IMPRESSIONS  1. Left ventricular ejection fraction, by estimation, is 60 to 65%. The left ventricle has normal function. The left ventricle has no regional wall motion abnormalities. There is mild left ventricular hypertrophy. Left ventricular diastolic parameters were normal.  2. Right ventricular systolic function is normal. The right ventricular size is normal.  3. The mitral valve is normal in structure. Trivial mitral valve regurgitation. No evidence of mitral stenosis.  4. The aortic valve is tricuspid. Aortic valve regurgitation is not visualized. No aortic stenosis is present.  5. The inferior vena cava is normal in size with greater than 50% respiratory variability, suggesting right atrial pressure of 3 mmHg. FINDINGS  Left Ventricle: Left ventricular ejection fraction, by estimation, is 60 to 65%. The left ventricle has normal function. The left ventricle has no regional wall motion abnormalities. The left ventricular internal cavity size was normal in size. There is  mild left ventricular hypertrophy. Left ventricular diastolic parameters were normal. Right Ventricle: The right ventricular size is normal. No increase in right ventricular wall thickness. Right ventricular systolic function is normal. Left Atrium: Left atrial size was normal in size. Right Atrium: Right atrial size was normal in size. Pericardium: There is no evidence of pericardial effusion. Mitral Valve: The mitral valve is normal in  structure. Trivial mitral valve regurgitation. No evidence of mitral valve stenosis. Tricuspid Valve: The tricuspid valve is normal in  structure. Tricuspid valve regurgitation is trivial. No evidence of tricuspid stenosis. Aortic Valve: The aortic valve is tricuspid. Aortic valve regurgitation is not visualized. No aortic stenosis is present. Pulmonic Valve: The pulmonic valve was normal in structure. Pulmonic valve regurgitation is not visualized. No evidence of pulmonic stenosis. Aorta: The aortic root is normal in size and structure. Venous: The inferior vena cava is normal in size with greater than 50% respiratory variability, suggesting right atrial pressure of 3 mmHg. IAS/Shunts: The interatrial septum was not well visualized.  LEFT VENTRICLE PLAX 2D LVIDd:         4.60 cm  Diastology LVIDs:         2.70 cm  LV e' medial:    9.90 cm/s LV PW:         1.20 cm  LV E/e' medial:  5.1 LV IVS:        1.20 cm  LV e' lateral:   8.05 cm/s LVOT diam:     2.30 cm  LV E/e' lateral: 6.3 LVOT Area:     4.15 cm  RIGHT VENTRICLE RV S prime:     10.60 cm/s LEFT ATRIUM           Index LA diam:      3.50 cm 1.57 cm/m LA Vol (A2C): 63.7 ml 28.65 ml/m LA Vol (A4C): 53.3 ml 23.97 ml/m   AORTA Ao Root diam: 3.10 cm MITRAL VALVE MV Area (PHT): 1.74 cm    SHUNTS MV Decel Time: 437 msec    Systemic Diam: 2.30 cm MV E velocity: 50.90 cm/s MV A velocity: 64.50 cm/s MV E/A ratio:  0.798 Charlton Haws MD Electronically signed by Charlton Haws MD Signature Date/Time: 07/26/2020/4:37:58 PM    Final         Subjective: Patient has converted to sinus rhythm, no further dyspnea or palpitations. Out of bed with no chest pain.   Discharge Exam: Vitals:   07/27/20 0632 07/27/20 0831  BP: (!) 129/58 (!) 131/57  Pulse:  68  Resp:  17  Temp:  97.9 F (36.6 C)  SpO2:  96%   Vitals:   07/27/20 0017 07/27/20 0435 07/27/20 0632 07/27/20 0831  BP: (!) 137/53 (!) 136/57 (!) 129/58 (!) 131/57  Pulse: (!) 57 (!) 59  68  Resp:  19  17   Temp:  97.7 F (36.5 C)  97.9 F (36.6 C)  TempSrc:  Oral  Oral  SpO2:  99%  96%  Weight:      Height:        General: Not in pain or dyspnea.  Neurology: Awake and alert, non focal  E ENT: no pallor, no icterus, oral mucosa moist Cardiovascular: No JVD. S1-S2 present, rhythmic, no gallops, rubs, or murmurs. No lower extremity edema. Pulmonary: positive breath sounds bilaterally, adequate air movement, no wheezing, rhonchi or rales. Gastrointestinal. Abdomen soft and non tender Skin. No rashes Musculoskeletal: no joint deformities   The results of significant diagnostics from this hospitalization (including imaging, microbiology, ancillary and laboratory) are listed below for reference.     Microbiology: Recent Results (from the past 240 hour(s))  SARS Coronavirus 2 by RT PCR (hospital order, performed in [facility] hospital lab) Nasopharyngeal Nasopharyngeal Swab     Status: None   Collection Time: 07/26/20  1:13 AM   Specimen: Nasopharyngeal Swab  Result Value Ref Range Status   SARS Coronavirus 2 NEGATIVE NEGATIVE Final    Comment: (NOTE) SARS-CoV-2 target nucleic acids are NOT DETECTED.  The  SARS-CoV-2 RNA is generally detectable in upper and lower respiratory specimens during the acute phase of infection. The lowest concentration of SARS-CoV-2 viral copies this assay can detect is 250 copies / mL. A negative result does not preclude SARS-CoV-2 infection and should not be used as the sole basis for treatment or other patient management decisions.  A negative result may occur with improper specimen collection / handling, submission of specimen other than nasopharyngeal swab, presence of viral mutation(s) within the areas targeted by this assay, and inadequate number of viral copies (<250 copies / mL). A negative result must be combined with clinical observations, patient history, and epidemiological information.  Fact Sheet for Patients:    BoilerBrush.com.cy  Fact Sheet for Healthcare Providers: https://pope.com/  This test is not yet approved or  cleared by the [place] FDA and has been authorized for detection and/or diagnosis of SARS-CoV-2 by FDA under an Emergency Use Authorization (EUA).  This EUA will remain in effect (meaning this test can be used) for the duration of the COVID-19 declaration under Section 564(b)(1) of the Act, 21 U.S.C. section 360bbb-3(b)(1), unless the authorization is terminated or revoked sooner.  Performed at [facility], [address] [place], [place] [address]      Labs: BNP (last 3 results) No results for input(s): BNP in the last 8760 hours. Basic Metabolic Panel: Recent Labs  Lab 07/25/20 2315 07/26/20 0452 07/27/20 0625  NA 141 142 141  K 3.9 4.2 4.0  CL 108 108 108  CO2 GLUCOSE 168* 128* 109*  BUN CREATININE 1.25* 1.18* 1.15*  CALCIUM 9.3 9.4 9.0  MG  --   --  1.8   Liver Function Tests: No results for input(s): AST, ALT, ALKPHOS, BILITOT, PROT, ALBUMIN in the last 168 hours. No results for input(s): LIPASE, AMYLASE in the last 168 hours. No results for input(s): AMMONIA in the last 168 hours. CBC: Recent Labs  Lab 07/25/20 2315 07/26/20 0452  WBC 7.7 7.4  HGB 12.0 12.9  HCT 37.0 38.9  MCV 87.9 86.8  PLT 255 283   Cardiac Enzymes: No results for input(s): CKTOTAL, CKMB, CKMBINDEX, TROPONINI in the last 168 hours. BNP: Invalid input(s): POCBNP CBG: Recent Labs  Lab 07/26/20 1248  GLUCAP 118*   D-Dimer Recent Labs    07/26/20 0452  DDIMER 0.31   Hgb A1c Recent Labs    07/26/20 0452  HGBA1C 6.3*   Lipid Profile No results for input(s): CHOL, HDL, LDLCALC, TRIG, CHOLHDL, LDLDIRECT in the last 72 hours. Thyroid function studies Recent Labs    07/25/20 2315  TSH 4.058   Anemia work up No results for input(s): VITAMINB12, FOLATE, FERRITIN, TIBC, IRON,  RETICCTPCT in the last 72 hours. Urinalysis    Component Value Date/Time   COLORURINE YELLOW 02/20/2016 0927   APPEARANCEUR CLEAR 02/20/2016 0927   LABSPEC 1.012 02/20/2016 0927   PHURINE 6.0 02/20/2016 0927   GLUCOSEU NEGATIVE 02/20/2016 0927   HGBUR NEGATIVE 02/20/2016 0927   BILIRUBINUR NEGATIVE 02/20/2016 0927   KETONESUR NEGATIVE 02/20/2016 0927   PROTEINUR NEGATIVE 02/20/2016 0927   NITRITE NEGATIVE 02/20/2016 0927   LEUKOCYTESUR MODERATE (A) 02/20/2016 0927   Sepsis Labs Invalid input(s): PROCALCITONIN,  WBC,  LACTICIDVEN Microbiology Recent Results (from the past 240 hour(s))  SARS Coronavirus 2 by RT PCR (hospital order, performed in [facility] hospital lab) Nasopharyngeal Nasopharyngeal Swab     Status: None   Collection Time: 07/26/20  1:13 AM  Specimen: Nasopharyngeal Swab  Result Value Ref Range Status   SARS Coronavirus 2 NEGATIVE NEGATIVE Final    Comment: (NOTE) SARS-CoV-2 target nucleic acids are NOT DETECTED.  The SARS-CoV-2 RNA is generally detectable in upper and lower respiratory specimens during the acute phase of infection. The lowest concentration of SARS-CoV-2 viral copies this assay can detect is 250 copies / mL. A negative result does not preclude SARS-CoV-2 infection and should not be used as the sole basis for treatment or other patient management decisions.  A negative result may occur with improper specimen collection / handling, submission of specimen other than nasopharyngeal swab, presence of viral mutation(s) within the areas targeted by this assay, and inadequate number of viral copies (<250 copies / mL). A negative result must be combined with clinical observations, patient history, and epidemiological information.  Fact Sheet for Patients:   BoilerBrush.com.cyhttps://www.fda.gov/media/136312/download  Fact Sheet for Healthcare Providers: https://pope.com/https://www.fda.gov/media/136313/download  This test is not yet approved or  cleared by the [place] FDA  and has been authorized for detection and/or diagnosis of SARS-CoV-2 by FDA under an Emergency Use Authorization (EUA).  This EUA will remain in effect (meaning this test can be used) for the duration of the COVID-19 declaration under Section 564(b)(1) of the Act, 21 U.S.C. section 360bbb-3(b)(1), unless the authorization is terminated or revoked sooner.  Performed at [facility], [address] [place], [place] [address]      Time coordinating discharge: 45 minutes  SIGNED:   Coralie KeensMauricio Daniel Freeman Borba, MD  [facility] 07/27/2020, 12:06 PM

## 2020-07-27 NOTE — Progress Notes (Signed)
Progress Note  Patient Name: Kristi Duncan Date of Encounter: 07/27/2020  [facility] Cardiologist: Will Jorja Loa, MD   Subjective   No CP or dyspnea  Inpatient Medications    Scheduled Meds: . aspirin EC  81 mg Oral Daily  . calcium carbonate  1 tablet Oral Daily  . diltiazem  60 mg Oral Q6H  . gabapentin  100 mg Oral QHS  . lidocaine  1 patch Transdermal Q0600  . loratadine  10 mg Oral Daily  . multivitamin with minerals  1 tablet Oral Daily   Continuous Infusions:  PRN Meds: acetaminophen, diazepam, hydroxypropyl methylcellulose / hypromellose, ondansetron **OR** ondansetron (ZOFRAN) IV, traMADol   Vital Signs    Vitals:   07/27/20 0017 07/27/20 0435 07/27/20 0632 07/27/20 0831  BP: (!) 137/53 (!) 136/57 (!) 129/58 (!) 131/57  Pulse: (!) 57 (!) 59  68  Resp:  19  17  Temp:  97.7 F (36.5 C)  97.9 F (36.6 C)  TempSrc:  Oral  Oral  SpO2:  99%  96%  Weight:      Height:       No intake or output data in the 24 hours ending 07/27/20 0941 Last 3 Weights 07/25/2020 01/12/2018 01/01/2018  Weight (lbs) 250 lb 239 lb 240 lb 9.6 oz  Weight (kg) 113.399 kg 108.41 kg 109.135 kg      Telemetry    Sinus bradycardia - Personally Reviewed   Physical Exam   GEN: No acute distress.   Neck: No JVD Cardiac: RRR, no murmurs, rubs, or gallops.  Respiratory: Clear to auscultation bilaterally. GI: Soft, nontender, non-distended  MS: No edema Neuro:  Nonfocal  Psych: Normal affect   Labs    High Sensitivity Troponin:   Recent Labs  Lab 07/26/20 0452  TROPONINIHS 10      Chemistry Recent Labs  Lab 07/25/20 2315 07/26/20 0452 07/27/20 0625  NA 141 142 141  K 3.9 4.2 4.0  CL 108 108 108  CO2 23 24 24   GLUCOSE 168* 128* 109*  BUN 17 15 16   CREATININE 1.25* 1.18* 1.15*  CALCIUM 9.3 9.4 9.0  GFRNONAA 46* 50* 51*  GFRAA 54* 58* 59*  ANIONGAP 10 10 9      Hematology Recent Labs  Lab 07/25/20 2315 07/26/20 0452  WBC 7.7 7.4  RBC 4.21 4.48    HGB 12.0 12.9  HCT 37.0 38.9  MCV 87.9 86.8  MCH 28.5 28.8  MCHC 32.4 33.2  RDW 13.5 13.6  PLT 255 283    DDimer  Recent Labs  Lab 07/26/20 0452  DDIMER 0.31     Radiology    DG Chest Portable 1 View  Result Date: 07/25/2020 CLINICAL DATA:  Dyspnea and palpitations. Lightheaded and chest discomfort. EXAM: PORTABLE CHEST 1 VIEW COMPARISON:  None. FINDINGS: Normal heart size and pulmonary vascularity. Lungs are clear. No pleural effusions. No pneumothorax. Mediastinal contours appear intact. Degenerative changes in the spine. IMPRESSION: No active disease. Electronically Signed   By: 09/25/20 M.D.   On: 07/25/2020 23:44   ECHOCARDIOGRAM COMPLETE  Result Date: 07/26/2020    ECHOCARDIOGRAM REPORT   Patient Name:   Kristi Duncan Date of Exam: 07/26/2020 Medical Rec #:  09/25/2020        Height:       67.0 in Accession #:    Arrie Eastern       Weight:       250.0 lb Date of Birth:  [birth year]  BSA:          2.223 m Patient Age:    59 years         BP:           148/83 mmHg Patient Gender: F                HR:           52 bpm. Exam Location:  Inpatient Procedure: 2D Echo, Cardiac Doppler and Color Doppler Indications:    R06.02 SOB  History:        Patient has prior history of Echocardiogram examinations, most                 recent 04/06/2017. Abnormal ECG, TIA and Stroke,                 Arrythmias:Atrial Fibrillation; Risk Factors:Hypertension and                 Dyslipidemia.  Sonographer:    Sheralyn Boatman RDCS Referring Phys: [id number] [facility] ARRIEN  Sonographer Comments: Technically difficult study due to poor echo windows and patient is morbidly obese. Image acquisition challenging due to patient body habitus and Image acquisition challenging due to respiratory motion. Machine malfunction during exam. IMPRESSIONS  1. Left ventricular ejection fraction, by estimation, is 60 to 65%. The left ventricle has normal function. The left ventricle has no regional wall motion  abnormalities. There is mild left ventricular hypertrophy. Left ventricular diastolic parameters were normal.  2. Right ventricular systolic function is normal. The right ventricular size is normal.  3. The mitral valve is normal in structure. Trivial mitral valve regurgitation. No evidence of mitral stenosis.  4. The aortic valve is tricuspid. Aortic valve regurgitation is not visualized. No aortic stenosis is present.  5. The inferior vena cava is normal in size with greater than 50% respiratory variability, suggesting right atrial pressure of 3 mmHg. FINDINGS  Left Ventricle: Left ventricular ejection fraction, by estimation, is 60 to 65%. The left ventricle has normal function. The left ventricle has no regional wall motion abnormalities. The left ventricular internal cavity size was normal in size. There is  mild left ventricular hypertrophy. Left ventricular diastolic parameters were normal. Right Ventricle: The right ventricular size is normal. No increase in right ventricular wall thickness. Right ventricular systolic function is normal. Left Atrium: Left atrial size was normal in size. Right Atrium: Right atrial size was normal in size. Pericardium: There is no evidence of pericardial effusion. Mitral Valve: The mitral valve is normal in structure. Trivial mitral valve regurgitation. No evidence of mitral valve stenosis. Tricuspid Valve: The tricuspid valve is normal in structure. Tricuspid valve regurgitation is trivial. No evidence of tricuspid stenosis. Aortic Valve: The aortic valve is tricuspid. Aortic valve regurgitation is not visualized. No aortic stenosis is present. Pulmonic Valve: The pulmonic valve was normal in structure. Pulmonic valve regurgitation is not visualized. No evidence of pulmonic stenosis. Aorta: The aortic root is normal in size and structure. Venous: The inferior vena cava is normal in size with greater than 50% respiratory variability, suggesting right atrial pressure of 3 mmHg.  IAS/Shunts: The interatrial septum was not well visualized.  LEFT VENTRICLE PLAX 2D LVIDd:         4.60 cm  Diastology LVIDs:         2.70 cm  LV e' medial:    9.90 cm/s LV PW:         1.20 cm  LV E/e'  medial:  5.1 LV IVS:        1.20 cm  LV e' lateral:   8.05 cm/s LVOT diam:     2.30 cm  LV E/e' lateral: 6.3 LVOT Area:     4.15 cm  RIGHT VENTRICLE RV S prime:     10.60 cm/s LEFT ATRIUM           Index LA diam:      3.50 cm 1.57 cm/m LA Vol (A2C): 63.7 ml 28.65 ml/m LA Vol (A4C): 53.3 ml 23.97 ml/m   AORTA Ao Root diam: 3.10 cm MITRAL VALVE MV Area (PHT): 1.74 cm    SHUNTS MV Decel Time: 437 msec    Systemic Diam: 2.30 cm MV E velocity: 50.90 cm/s MV A velocity: 64.50 cm/s MV E/A ratio:  0.798 Charlton Haws MD Electronically signed by Charlton Haws MD Signature Date/Time: 07/26/2020/4:37:58 PM    Final      Patient Profile     59 y.o. female admitted with atrial fibrillation.  Echocardiogram shows normal LV function.  Assessment & Plan    1 paroxysmal atrial fibrillation-patient has converted to sinus rhythm.  TSH is normal as is LV function on echocardiogram.  We will change Cardizem to 240 mg CD daily.  If she continues to have recurrences she would benefit from either an antiarrhythmic or consideration of ablation.  She will follow up with her cardiologist at [facility] to discuss this further.  She has declined anticoagulation and understands the higher risk of CVA.  2 snoring-she needs a sleep evaluation following discharge with her primary care or her outpatient cardiologist.  3 hypertension-blood pressure appears to be reasonably well controlled with Cardizem.  We will continue with 240 mg daily.  Would discontinue amlodipine and atenolol which she was taking at time of admission.  Patient can be discharged on present medications.  She needs follow-up with her cardiologist at [facility] in the next 4 to 6 weeks.  We will sign off.  Please call with questions.  For questions or updates,  please contact [facility] Please consult [email or web address] for contact info under        Signed, Olga Millers, MD  07/27/2020, 9:41 AM

## 2020-09-22 ENCOUNTER — Other Ambulatory Visit: Payer: Self-pay | Admitting: Family Medicine

## 2021-03-24 ENCOUNTER — Emergency Department (HOSPITAL_COMMUNITY): Payer: BLUE CROSS/BLUE SHIELD

## 2021-03-24 ENCOUNTER — Other Ambulatory Visit: Payer: Self-pay

## 2021-03-24 ENCOUNTER — Encounter (HOSPITAL_COMMUNITY): Payer: Self-pay | Admitting: Emergency Medicine

## 2021-03-24 ENCOUNTER — Emergency Department (HOSPITAL_COMMUNITY)
Admission: EM | Admit: 2021-03-24 | Discharge: 2021-03-24 | Disposition: A | Discharge status: Home / Self Care | Payer: BLUE CROSS/BLUE SHIELD | Attending: Emergency Medicine | Admitting: Emergency Medicine

## 2021-03-24 DIAGNOSIS — E039 Hypothyroidism, unspecified: Secondary | ICD-10-CM | POA: Diagnosis not present

## 2021-03-24 DIAGNOSIS — R519 Headache, unspecified: Secondary | ICD-10-CM

## 2021-03-24 DIAGNOSIS — Z87891 Personal history of nicotine dependence: Secondary | ICD-10-CM | POA: Insufficient documentation

## 2021-03-24 DIAGNOSIS — I10 Essential (primary) hypertension: Secondary | ICD-10-CM | POA: Diagnosis not present

## 2021-03-24 DIAGNOSIS — Z79899 Other long term (current) drug therapy: Secondary | ICD-10-CM | POA: Diagnosis not present

## 2021-03-24 DIAGNOSIS — Z7982 Long term (current) use of aspirin: Secondary | ICD-10-CM | POA: Insufficient documentation

## 2021-03-24 LAB — CBC
HCT: 39.4 % (ref 36.0–46.0)
Hemoglobin: 13.2 g/dL (ref 12.0–15.0)
MCH: 28.9 pg (ref 26.0–34.0)
MCHC: 33.5 g/dL (ref 30.0–36.0)
MCV: 86.2 fL (ref 80.0–100.0)
Platelets: 291 10*3/uL (ref 150–400)
RBC: 4.57 MIL/uL (ref 3.87–5.11)
RDW: 12.9 % (ref 11.5–15.5)
WBC: 5.8 10*3/uL (ref 4.0–10.5)
nRBC: 0 % (ref 0.0–0.2)

## 2021-03-24 LAB — BASIC METABOLIC PANEL
Anion gap: 10 (ref 5–15)
BUN: 10 mg/dL (ref 8–23)
CO2: 22 mmol/L (ref 22–32)
Calcium: 9.6 mg/dL (ref 8.9–10.3)
Chloride: 103 mmol/L (ref 98–111)
Creatinine, Ser: 0.94 mg/dL (ref 0.44–1.00)
GFR, Estimated: >60 mL/min (ref >60)
Glucose, Bld: 116 mg/dL — ABNORMAL HIGH (ref 70–99)
Potassium: 3.7 mmol/L (ref 3.5–5.1)
Sodium: 135 mmol/L (ref 135–145)

## 2021-03-24 LAB — TROPONIN I (HIGH SENSITIVITY): Troponin I (High Sensitivity): 5 ng/L (ref <18)

## 2021-03-24 MED ORDER — DILTIAZEM HCL 120 MG PO TABS: 120.0000 mg | ORAL_TABLET | Freq: Two times a day (BID) | ORAL | 0 refills | Status: AC | PRN

## 2021-03-24 MED ORDER — DILTIAZEM HCL 60 MG PO TABS
120.0000 mg | ORAL_TABLET | Freq: Once | ORAL | Status: AC
  Administered 2021-03-24: 120 mg via ORAL
  Filled 2021-03-24: qty 2

## 2021-03-24 MED ORDER — METOCLOPRAMIDE HCL 10 MG PO TABS: 5.0000 mg | ORAL_TABLET | Freq: Two times a day (BID) | ORAL | 0 refills | Status: AC | PRN

## 2021-03-24 MED ORDER — DIPHENHYDRAMINE HCL 25 MG PO TABS: 25.0000 mg | ORAL_TABLET | Freq: Three times a day (TID) | ORAL | 0 refills | Status: AC | PRN

## 2021-03-24 NOTE — ED Provider Notes (Signed)
MSE was initiated and I personally evaluated the patient and placed orders (if any) at  1:24 PM on Mar 24, 2021.  The patient appears stable so that the remainder of the MSE may be completed by another provider.   Chief Complaint: htn   HPI:  Pt states that she has been more hypertensive than normal over the past couple of days, more than normal. Has been complaint with her medications.  Is complaining of shortness of breath and headache.  States that headache is mild, however worsened over this morning. Started this AM.  Primarily on the left side.  Also admits to an episode of blurry vision.  Denies any weakness, numbness or tingling.   ROS:  Hypertension, HA, sob   Physical Exam:  Gen:                Awake, no distress  HEENT:          Atraumatic  Resp:              Normal effort  Cardiac:          Normal rate  Abd:                Nondistended, nontender  MSK:              Moves extremities without difficulty  Neuro:            Speech clear,EOMS intact      Initiation of care has begun. The patient has been counseled on the process, plan, and necessity for staying for the completion/evaluation, and the remainder of the medical screening examination    Farrel Gordon, PA-C 03/24/21 1329    Mancel Bale, MD 03/24/21 2017

## 2021-03-24 NOTE — ED Triage Notes (Signed)
Pt reports blood pressure elevated and blurred vision since yesterday.  Reports SOB, headache, and feeling strange this morning.  No neuro deficits noted.  Hx of afib.

## 2021-03-24 NOTE — ED Notes (Signed)
Pt d/c home per MD order. Discharge summary reviewed with pt, pt verbalizes understanding. Voicing no complaints . Off unit via WC, Pt family here for discharge ride home.

## 2021-03-24 NOTE — Discharge Instructions (Signed)
We talked about your high blood pressure and your headache today.  We talked about adding a new medication, diltiazem 120 mg tablet, to your current diltiazem medication.  Check your blood pressure in the morning.  If your systolic pressures over 150 mmhg (top number), on both arms (a repeat check), take the extra tablet with your normal 240 mg diltiazem.  If your blood pressure in the evening after 6 pm is still above 160 mmhg, you can take a second dose of the 120 mg tablet.  Blood pressure can be very fickle.  It can go up and down over the course of the few days for many reasons.  It is very important that you monitor your pressures closely, and are not being overmedicated to bottom your pressure out.  It is also very important that you call your cardiologist office on Monday to discuss this new medication, and see if they have any other recommendations.  *  I expect your headache to gradually improve over the next few days.  You can take Benadryl at night (25 mg ) before bedtime to help with headache, and tylenol in the daytime (650 mg every 6 hours).  If this doesn't help, I prescribed reglan for bad headaches. Take 5 mg reglan (half tablet) with 25 mg benadryl at home.  This will make you drowsy!  Do not drive after taking.  If your headache becomes more severe, your vision becomes blurry or blacks out, you begin to feel very lightheaded, you lose consciousness, or begin having numbness, weakness, or any signs of a stroke, call 911 and return to the ER immediately.

## 2021-03-24 NOTE — ED Provider Notes (Signed)
[facility] EMERGENCY DEPARTMENT Provider Note   CSN: [id number] Arrival date & time: 03/24/21  1254     History Chief Complaint  Patient presents with  . Hypertension    Kristi Duncan is a 60 y.o. female w/ hx of HTN, parox A Fib, HLD here with hypertension and headache.  She reports noting she has a mild diffuse headache beginnning 2 days ago.  She checks her BP regularly at home - normally it is around 130 systolic, but it has ranged near 160-164mmhg systolic the past 2 days, and overnight reached 200 systolic.  She takes diltiazem 240 mg ER daily and hasn't missed any doses.  She was taken of atenolol and amlodipine in the past.  She has allergies to HCTZ and lisinopril.  She does see a cardiologist.  She reports eating take out yesterday with wings, fried rice, which was unusual and maybe heavier than normal.  She hasn't eaten yet today.  She denies fevers, chills, chest pain, shortness of breath, numbness or weakness.  Her headache is mild intensity.  Denies current vision changes, or nausea or vomiting.  Reports hx of TIA in 2018, although record review of MRI at that time shows no acute infarct.  She has had covid vaccine x 3.  No dysuria symptoms.  HPI     Past Medical History:  Diagnosis Date  . Chronic back pain   . Hyperlipidemia   . Hypertension   . Hypothyroidism   . Mild carotid artery disease (HCC) 2018  . Obesity   . Osteoarthritis   . PAF (paroxysmal atrial fibrillation) (HCC)   . TIA (transient ischemic attack)     Patient Active Problem List   Diagnosis Date Noted  . Atrial fibrillation with RVR (HCC) 07/26/2020  . TIA (transient ischemic attack) 04/18/2017  . Stroke determined by clinical assessment (HCC)   . Chronic back pain   . Sickle cell trait (HCC) 02/05/2016  . Hypertension 02/05/2016  . Hyperlipidemia 02/05/2016  . Hypothyroidism 02/05/2016  . Vitamin D deficiency 02/05/2016  . Health care maintenance 02/05/2016  .  Atrial fibrillation (HCC) 02/05/2016    Past Surgical History:  Procedure Laterality Date  . ABDOMINAL HYSTERECTOMY  2008  . CHOLECYSTECTOMY  1992     OB History    Gravida  4   Para      Term      Preterm      AB      Living  4     SAB      IAB      Ectopic      Multiple      Live Births              Family History  Problem Relation Age of Onset  . Heart disease Maternal Grandmother   . Stroke Maternal Grandfather   . Heart disease Brother        Died of presumed MI age 44, no autopsy    Social History   Tobacco Use  . Smoking status: Former Smoker    Types: Cigarettes    Quit date: 11/19/1991    Years since quitting: 29.3  . Smokeless tobacco: Never Used  Vaping Use  . Vaping Use: Never used  Substance Use Topics  . Alcohol use: Yes    Comment: occasional   . Drug use: No    Home Medications Prior to Admission medications   Medication Sig Start Date End Date Taking? Authorizing Provider  diltiazem (CARDIZEM) 120 MG tablet Take 1 tablet (120 mg total) by mouth 2 (two) times daily as needed. For systolic blood pressure over 160 mmhg 03/24/21 04/23/21 Yes Janett Kamath, Kermit Balo, MD  diphenhydrAMINE (BENADRYL) 25 MG tablet Take 1 tablet (25 mg total) by mouth every 8 (eight) hours as needed for up to 15 doses (headache). 03/24/21  Yes Jonathon Tan, Kermit Balo, MD  metoCLOPramide (REGLAN) 10 MG tablet Take 0.5 tablets (5 mg total) by mouth 2 (two) times daily as needed for up to 10 doses for nausea. For severe headache.  Take with 25 mg benadryl. 03/24/21  Yes Argyle Gustafson, Kermit Balo, MD  acetaminophen (TYLENOL) 500 MG tablet Take 500 mg by mouth every 6 (six) hours as needed for mild pain, moderate pain, fever or headache.    [provider]  aspirin 81 MG chewable tablet Chew 81 mg by mouth daily.    [provider]  calcium carbonate (OS-CAL) 1250 (500 Ca) MG chewable tablet Chew 1-2 tablets by mouth daily as needed for indigestion.    [provider]  diazepam (VALIUM) 2 MG tablet Take 2 mg by mouth every 6 (six) hours as needed for anxiety. 07/07/20   [provider]  diltiazem (CARDIZEM CD) 240 MG 24 hr capsule Take 1 capsule (240 mg total) by mouth daily. 07/28/20 08/27/20  Arrien, York Ram, MD  gabapentin (NEURONTIN) 100 MG capsule Take 1 capsule (100 mg total) by mouth at bedtime. 06/14/19   Caro Laroche, DO  lidocaine (LIDODERM) 5 % Place 1 patch onto the skin daily. Remove & Discard patch within 12 hours or as directed by MD Patient taking differently: Place 1 patch onto the skin daily as needed (pain). Remove & Discard patch within 12 hours or as directed by MD as needed 06/14/19   Caro Laroche, DO  loratadine (CLARITIN) 10 MG tablet Take 10 mg by mouth daily.    [provider]  Menthol, Topical Analgesic, (BIOFREEZE) 4 % GEL Apply 1 application topically daily as needed for pain.    [provider]  Multiple Vitamin (MULTIVITAMIN) capsule Take 1 capsule by mouth daily. Centrum Silver    [provider]  Propylene Glycol (SYSTANE BALANCE) 0.6 % SOLN Apply 1 drop to eye daily as needed (dry eyes).    [provider]  traMADol (ULTRAM) 50 MG tablet Take 50 mg by mouth every 6 (six) hours as needed for pain. 07/07/20   [provider]    Allergies    Eliquis [apixaban], Iodine, Pravastatin, Butorphanol, Celecoxib, Codeine, Hctz [hydrochlorothiazide], Hydrocodone, Iodinated diagnostic agents, Prednisone, Simvastatin, Vitamin d analogs, Xarelto [rivaroxaban], Nsaids, and Tape  Review of Systems   Review of Systems  Constitutional: Negative for chills and fever.  HENT: Negative for ear pain and sore throat.   Eyes: Negative for photophobia and visual disturbance.  Respiratory: Negative for cough and shortness of breath.   Cardiovascular: Negative for chest pain and palpitations.  Gastrointestinal: Negative for abdominal pain, nausea and vomiting.  Genitourinary:  Negative for dysuria and hematuria.  Musculoskeletal: Negative for arthralgias and back pain.  Skin: Negative for color change and rash.  Neurological: Positive for headaches. Negative for syncope.  All other systems reviewed and are negative.   Physical Exam Updated Vital Signs BP (!) 156/66   Pulse 68   Temp 98.5 F (36.9 C)   Resp 13   SpO2 99%   Physical Exam Constitutional:      General: She is not in  acute distress. HENT:     Head: Normocephalic and atraumatic.  Eyes:     Conjunctiva/sclera: Conjunctivae normal.     Pupils: Pupils are equal, round, and reactive to light.  Cardiovascular:     Rate and Rhythm: Normal rate and regular rhythm.  Pulmonary:     Effort: Pulmonary effort is normal. No respiratory distress.  Abdominal:     General: There is no distension.     Tenderness: There is no abdominal tenderness.  Skin:    General: Skin is warm and dry.  Neurological:     General: No focal deficit present.     Mental Status: She is alert and oriented to person, place, and time. Mental status is at baseline.     Sensory: No sensory deficit.     Motor: No weakness.  Psychiatric:        Mood and Affect: Mood normal.        Behavior: Behavior normal.     ED Results / Procedures / Treatments   Labs (all labs ordered are listed, but only abnormal results are displayed) Labs Reviewed  BASIC METABOLIC PANEL - Abnormal; Notable for the following components:      Result Value   Glucose, Bld 116 (*)    All other components within normal limits  CBC  TROPONIN I (HIGH SENSITIVITY)    EKG EKG Interpretation  Date/Time:  Saturday Mar 24 2021 13:24:55 EDT Ventricular Rate:  92 PR Interval:  144 QRS Duration: 78 QT Interval:  358 QTC Calculation: 442 R Axis:   13 Text Interpretation: Normal sinus rhythm No STEMI Confirmed by Alvester Chourifan, Jovontae Banko [phone]([id number]) on 03/24/2021 3:06:30 PM   Radiology DG Chest 2 View  Result Date: 03/24/2021 CLINICAL DATA:  Shortness of  breath. Hypertension and blurred vision since yesterday. Ex-smoker. EXAM: CHEST - 2 VIEW COMPARISON:  07/25/2020 FINDINGS: Normal sized heart. Tortuous aorta. Clear lungs. Stable mild peribronchial thickening and mildly prominent interstitial markings normal vascularity. Thoracic spine degenerative changes. IMPRESSION: 1. No acute abnormality. 2. Stable mild chronic bronchitic changes. Electronically Signed   By: Beckie SaltsSteven  Reid M.D.   On: 03/24/2021 13:57   CT Head Wo Contrast  Result Date: 03/24/2021 CLINICAL DATA:  Headache EXAM: CT HEAD WITHOUT CONTRAST TECHNIQUE: Contiguous axial images were obtained from the base of the skull through the vertex without intravenous contrast. COMPARISON:  CT head dated 04/05/2017. FINDINGS: Brain: No evidence of acute infarction, hemorrhage, hydrocephalus, extra-axial collection or mass lesion/mass effect. Vascular: No hyperdense vessel or unexpected calcification. Skull: Normal. Negative for fracture or focal lesion. Sinuses/Orbits: No acute finding. Other: None. IMPRESSION: No acute intracranial process. Electronically Signed   By: Romona Curlsyler  Litton M.D.   On: 03/24/2021 14:10    Procedures Procedures   Medications Ordered in ED Medications  diltiazem (CARDIZEM) tablet 120 mg (120 mg Oral Given 03/24/21 1554)    ED Course  I have reviewed the triage vital signs and the nursing notes.  Pertinent labs & imaging results that were available during my care of the patient were reviewed by me and considered in my medical decision making (see chart for details).  This patient complains of hypertension, headache.  Her headache is minimal, she has a benign neurological exam.  Her blood pressure here has been only mildly elevated, 130-160 systolic.  She has no evidence of stroke on exam.  I likewise doubt this is PRES. I doubt she is having hypertensive emergency.  EKG is nonischemic per my review.  Troponin is 5 with >12  hour symptoms, and no chest pain - doubt ACS.  CBC  normal.  CT chest reviewed and normal.  CT scan of the head ordered from triage, reviewed, with no focal findings.  We discussed her medication regimen at home.  I think be reasonable to give her additional 120 mg diltiazem tablets, which she can take as needed in addition to her current 2040 mg tablets.  We discussed parameters for taking this medication at home.  She is reliably taking her blood pressure at home.  Advised to continue keeping a journal as she has done so far, that she call her cardiologist on Monday to discuss his new medication.  Because she is limited largely to Tylenol for pain, we talked about adding low-dose Reglan and Benadryl as needed for the next day or 2.  I carefully reviewed return precautions for worsening neurological symptoms including severe headache, numbness or weakness, stroke symptoms, blurred vision or loss of vision.  I discussed with her the rare possibility of a small stroke that we were not able to visualize on her CT scan initially, or other signs of hypertensive emergency including chest pain and pressure, or PRES. She verbalized understanding.  I think it is reasonable to continue managing this at home.  I also advised that she watch her salt intake, which may have been a trigger for her current episode.     Final Clinical Impression(s) / ED Diagnoses Final diagnoses:  Hypertension, unspecified type  Nonintractable headache, unspecified chronicity pattern, unspecified headache type    Rx / DC Orders ED Discharge Orders         Ordered    diphenhydrAMINE (BENADRYL) 25 MG tablet  Every 8 hours PRN        03/24/21 1546    metoCLOPramide (REGLAN) 10 MG tablet  2 times daily PRN        03/24/21 1546    diltiazem (CARDIZEM) 120 MG tablet  2 times daily PRN        03/24/21 1546           Terald Sleeper, MD 03/24/21 1610

## 2021-10-31 ENCOUNTER — Emergency Department (HOSPITAL_COMMUNITY): Payer: BLUE CROSS/BLUE SHIELD

## 2021-10-31 ENCOUNTER — Emergency Department (HOSPITAL_COMMUNITY)
Admission: EM | Admit: 2021-10-31 | Discharge: 2021-11-01 | Disposition: A | Discharge status: Home / Self Care | Payer: BLUE CROSS/BLUE SHIELD | Attending: Emergency Medicine | Admitting: Emergency Medicine

## 2021-10-31 ENCOUNTER — Other Ambulatory Visit: Payer: Self-pay

## 2021-10-31 DIAGNOSIS — Z7982 Long term (current) use of aspirin: Secondary | ICD-10-CM | POA: Insufficient documentation

## 2021-10-31 DIAGNOSIS — I10 Essential (primary) hypertension: Secondary | ICD-10-CM | POA: Diagnosis not present

## 2021-10-31 DIAGNOSIS — Z87891 Personal history of nicotine dependence: Secondary | ICD-10-CM | POA: Insufficient documentation

## 2021-10-31 DIAGNOSIS — R000 Tachycardia, unspecified: Secondary | ICD-10-CM | POA: Diagnosis present

## 2021-10-31 DIAGNOSIS — E039 Hypothyroidism, unspecified: Secondary | ICD-10-CM | POA: Diagnosis not present

## 2021-10-31 DIAGNOSIS — I4891 Unspecified atrial fibrillation: Secondary | ICD-10-CM | POA: Insufficient documentation

## 2021-10-31 LAB — PROTIME-INR
INR: 1 (ref 0.8–1.2)
Prothrombin Time: 13.2 seconds (ref 11.4–15.2)

## 2021-10-31 LAB — COMPREHENSIVE METABOLIC PANEL
ALT: 23 U/L (ref 0–44)
AST: 25 U/L (ref 15–41)
Albumin: 3.9 g/dL (ref 3.5–5.0)
Alkaline Phosphatase: 68 U/L (ref 38–126)
Anion gap: 10 (ref 5–15)
BUN: 16 mg/dL (ref 8–23)
CO2: 22 mmol/L (ref 22–32)
Calcium: 9.3 mg/dL (ref 8.9–10.3)
Chloride: 107 mmol/L (ref 98–111)
Creatinine, Ser: 1.19 mg/dL — ABNORMAL HIGH (ref 0.44–1.00)
GFR, Estimated: 52 mL/min — ABNORMAL LOW (ref >60)
Glucose, Bld: 147 mg/dL — ABNORMAL HIGH (ref 70–99)
Potassium: 3.5 mmol/L (ref 3.5–5.1)
Sodium: 139 mmol/L (ref 135–145)
Total Bilirubin: 0.5 mg/dL (ref 0.3–1.2)
Total Protein: 7.4 g/dL (ref 6.5–8.1)

## 2021-10-31 LAB — CBC WITH DIFFERENTIAL/PLATELET
Abs Immature Granulocytes: 0.02 10*3/uL (ref 0.00–0.07)
Basophils Absolute: 0.1 10*3/uL (ref 0.0–0.1)
Basophils Relative: 1 %
Eosinophils Absolute: 0.3 10*3/uL (ref 0.0–0.5)
Eosinophils Relative: 4 %
HCT: 38.7 % (ref 36.0–46.0)
Hemoglobin: 12.9 g/dL (ref 12.0–15.0)
Immature Granulocytes: 0 %
Lymphocytes Relative: 27 %
Lymphs Abs: 1.7 10*3/uL (ref 0.7–4.0)
MCH: 28.5 pg (ref 26.0–34.0)
MCHC: 33.3 g/dL (ref 30.0–36.0)
MCV: 85.6 fL (ref 80.0–100.0)
Monocytes Absolute: 0.7 10*3/uL (ref 0.1–1.0)
Monocytes Relative: 11 %
Neutro Abs: 3.6 10*3/uL (ref 1.7–7.7)
Neutrophils Relative %: 57 %
Platelets: 292 10*3/uL (ref 150–400)
RBC: 4.52 MIL/uL (ref 3.87–5.11)
RDW: 12.8 % (ref 11.5–15.5)
WBC: 6.4 10*3/uL (ref 4.0–10.5)
nRBC: 0 % (ref 0.0–0.2)

## 2021-10-31 LAB — MAGNESIUM: Magnesium: 1.9 mg/dL (ref 1.7–2.4)

## 2021-10-31 LAB — APTT: aPTT: 35 seconds (ref 24–36)

## 2021-10-31 MED ORDER — DILTIAZEM HCL-DEXTROSE 125-5 MG/125ML-% IV SOLN (PREMIX)
5.0000 mg/h | INTRAVENOUS | Status: DC
Start: 2021-10-31 — End: 2021-11-01
  Filled 2021-10-31: qty 125

## 2021-10-31 MED ORDER — HEPARIN (PORCINE) 25000 UT/250ML-% IV SOLN: 1100.0000 [IU]/h | INTRAVENOUS | Status: DC

## 2021-10-31 MED ORDER — HEPARIN BOLUS VIA INFUSION
4000.0000 [IU] | Freq: Once | INTRAVENOUS | Status: DC
  Filled 2021-10-31: qty 4000

## 2021-10-31 MED ORDER — MAGNESIUM SULFATE IN D5W 1-5 GM/100ML-% IV SOLN
1.0000 g | Freq: Once | INTRAVENOUS | Status: AC
  Administered 2021-11-01: 1 g via INTRAVENOUS
  Filled 2021-10-31 (×2): qty 100

## 2021-10-31 MED ORDER — POTASSIUM CHLORIDE CRYS ER 20 MEQ PO TBCR
40.0000 meq | EXTENDED_RELEASE_TABLET | Freq: Once | ORAL | Status: AC
  Administered 2021-10-31: 40 meq via ORAL
  Filled 2021-10-31: qty 2

## 2021-10-31 NOTE — ED Notes (Signed)
ED Provider at bedside. 

## 2021-10-31 NOTE — ED Notes (Signed)
O2 placed at 2liters [place] per pt request for comfort.  99% on RA

## 2021-10-31 NOTE — Progress Notes (Signed)
ANTICOAGULATION CONSULT NOTE - Initial Consult  Pharmacy Consult for heparin  Indication: atrial fibrillation  Allergies  Allergen Reactions   Eliquis [Apixaban] Other (See Comments)    Bleeding   Iodine Hives   Pravastatin Other (See Comments)    Severe abdominal pain and leg cramping   Butorphanol     Other reaction(s): Other (See Comments)   Celecoxib Nausea Only   Codeine Nausea And Vomiting   Hctz [Hydrochlorothiazide] Other (See Comments)    Severe cramping    Hydrocodone Other (See Comments)    Severe back and heart rate    Iodinated Diagnostic Agents Hives   Prednisone Other (See Comments)    Shaky, "wasn't feeling herself"   Simvastatin Other (See Comments)    Severe cramping    Vitamin D Analogs Other (See Comments)    Severe pain from head to toe    Xarelto [Rivaroxaban] Other (See Comments)    Severe bleeding    Nsaids Hives   Tape Rash    Patient Measurements: Wt= 112kg (on 10/17/21) Ht= 67 in Heparin dosing wt: 77kg  Vital Signs: Temp: 98 F (36.7 C) (12/14 2204) Temp Source: Oral (12/14 2204) BP: 134/104 (12/14 2245) Pulse Rate: 129 (12/14 2245)  Labs: Recent Labs    10/31/21 2230  HGB 12.9  HCT 38.7  PLT 292  APTT 35  LABPROT 13.2  INR 1.0  CREATININE 1.19*    CrCl cannot be calculated (Unknown ideal weight.).   Medical History: Past Medical History:  Diagnosis Date   Chronic back pain    Hyperlipidemia    Hypertension    Hypothyroidism    Mild carotid artery disease (HCC) 2018   Obesity    Osteoarthritis    PAF (paroxysmal atrial fibrillation) (HCC)    TIA (transient ischemic attack)       Assessment: 60 yo female with afib (CHADSVASC= 3) to start heparin. No anticoagulants noted PTA.    Goal of Therapy:  Heparin level 0.3-0.7 units/ml Monitor platelets by anticoagulation protocol: Yes   Plan:  -Heparin bolus 4000 units IV followed by 1100 units/hr  -Heparin level in 6 hours and daily wth CBC daily  Harland German, PharmD Clinical Pharmacist **Pharmacist phone directory can now be found on [email or web address] ([email or web address]).  Listed under [facility].

## 2021-10-31 NOTE — ED Triage Notes (Signed)
Pt BIB EMS for afib .  Hx of same.  Pt did feel shob at home when HR was 170.  Pt took her cardizem 120 as directed and called EMS.  She also took 4 baby aspirin as directed by EMS dispatch.

## 2021-10-31 NOTE — ED Provider Notes (Signed)
[facility] EMERGENCY DEPARTMENT Provider Note   CSN: [id number] Arrival date & time: 10/31/21  2153     History Chief Complaint  Patient presents with   Tachycardia    Kristi Duncan is a 60 y.o. female.  HPI Patient is a 60 year old female with a history of hypertension, hypothyroidism, PAF, TIA, hyperlipidemia, who presents to the emergency department due to palpitations and shortness of breath.  Patient states that she has a history of PAF and last night began experiencing a short episode of palpitations with shortness of breath as well as lightheadedness.  This quickly resolved.  This evening her symptoms started once again and have been persistent.  She states that she takes 240 mg of Cardizem once daily in the a.m. and when experiencing these episodes she also takes 120 mg of additional Cardizem.  She states she took this just prior to calling EMS.  EMS directed her to take 324 mg of aspirin as well.  Patient reports current shortness of breath and lightheadedness.  No syncope.  No chest pain.  No nausea, vomiting, diarrhea.  No abdominal pain.  Patient states that she has attempted taking both Eliquis as well as Xarelto in the past but experiences severe rectal and vaginal bleeding when taking these so she is currently not anticoagulated.    Past Medical History:  Diagnosis Date   Chronic back pain    Hyperlipidemia    Hypertension    Hypothyroidism    Mild carotid artery disease ([place]) 2018   Obesity    Osteoarthritis    PAF (paroxysmal atrial fibrillation) (HCC)    TIA (transient ischemic attack)     Patient Active Problem List   Diagnosis Date Noted   Atrial fibrillation with RVR ([place]) 07/26/2020   TIA (transient ischemic attack) 04/18/2017   Stroke determined by clinical assessment ([place])    Chronic back pain    Sickle cell trait ([place]) 02/05/2016   Hypertension 02/05/2016   Hyperlipidemia 02/05/2016   Hypothyroidism 02/05/2016   Vitamin D  deficiency 02/05/2016   Health care maintenance 02/05/2016   Atrial fibrillation ([place]) 02/05/2016    Past Surgical History:  Procedure Laterality Date   ABDOMINAL HYSTERECTOMY  2008   CHOLECYSTECTOMY  1992     OB History     Gravida  4   Para      Term      Preterm      AB      Living  4      SAB      IAB      Ectopic      Multiple      Live Births              Family History  Problem Relation Age of Onset   Heart disease Maternal Grandmother    Stroke Maternal Grandfather    Heart disease Brother        Died of presumed MI age 26, no autopsy    Social History   Tobacco Use   Smoking status: Former    Types: Cigarettes    Quit date: 11/19/1991    Years since quitting: 29.9   Smokeless tobacco: Never  Vaping Use   Vaping Use: Never used  Substance Use Topics   Alcohol use: Yes    Comment: occasional    Drug use: No    Home Medications Prior to Admission medications   Medication Sig Start Date End Date Taking? Authorizing Provider  acetaminophen (  TYLENOL) 500 MG tablet Take 500 mg by mouth every 6 (six) hours as needed for mild pain, moderate pain, fever or headache.    [provider]  aspirin 81 MG chewable tablet Chew 81 mg by mouth daily.    [provider]  calcium carbonate (OS-CAL) 1250 (500 Ca) MG chewable tablet Chew 1-2 tablets by mouth daily as needed for indigestion.    [provider]  diazepam (VALIUM) 2 MG tablet Take 2 mg by mouth every 6 (six) hours as needed for anxiety. 07/07/20   [provider]  diltiazem (CARDIZEM CD) 240 MG 24 hr capsule Take 1 capsule (240 mg total) by mouth daily. 07/28/20 08/27/20  Arrien, York Ram, MD  diltiazem (CARDIZEM) 120 MG tablet Take 1 tablet (120 mg total) by mouth 2 (two) times daily as needed. For systolic blood pressure over 160 mmhg 03/24/21 04/23/21  Terald Sleeper, MD  diphenhydrAMINE (BENADRYL) 25 MG tablet Take 1 tablet (25 mg total) by mouth  every 8 (eight) hours as needed for up to 15 doses (headache). 03/24/21   Terald Sleeper, MD  gabapentin (NEURONTIN) 100 MG capsule Take 1 capsule (100 mg total) by mouth at bedtime. 06/14/19   Caro Laroche, DO  lidocaine (LIDODERM) 5 % Place 1 patch onto the skin daily. Remove & Discard patch within 12 hours or as directed by MD Patient taking differently: Place 1 patch onto the skin daily as needed (pain). Remove & Discard patch within 12 hours or as directed by MD as needed 06/14/19   Caro Laroche, DO  loratadine (CLARITIN) 10 MG tablet Take 10 mg by mouth daily.    [provider]  Menthol, Topical Analgesic, (BIOFREEZE) 4 % GEL Apply 1 application topically daily as needed for pain.    [provider]  metoCLOPramide (REGLAN) 10 MG tablet Take 0.5 tablets (5 mg total) by mouth 2 (two) times daily as needed for up to 10 doses for nausea. For severe headache.  Take with 25 mg benadryl. 03/24/21   Terald Sleeper, MD  Multiple Vitamin (MULTIVITAMIN) capsule Take 1 capsule by mouth daily. Centrum Silver    [provider]  Propylene Glycol (SYSTANE BALANCE) 0.6 % SOLN Apply 1 drop to eye daily as needed (dry eyes).    [provider]  traMADol (ULTRAM) 50 MG tablet Take 50 mg by mouth every 6 (six) hours as needed for pain. 07/07/20   [provider]    Allergies    Eliquis [apixaban], Iodine, Pravastatin, Butorphanol, Celecoxib, Codeine, Hctz [hydrochlorothiazide], Hydrocodone, Iodinated diagnostic agents, Prednisone, Simvastatin, Vitamin d analogs, Xarelto [rivaroxaban], Nsaids, and Tape  Review of Systems   Review of Systems  All other systems reviewed and are negative. Ten systems reviewed and are negative for acute change, except as noted in the HPI.   Physical Exam Updated Vital Signs BP (!) 129/59    Pulse (!) 52    Temp 98 F (36.7 C) (Oral)    Resp 16    SpO2 98%   Physical Exam Vitals and nursing note reviewed.  Constitutional:       General: She is not in acute distress.    Appearance: Normal appearance. She is not ill-appearing, toxic-appearing or diaphoretic.  HENT:     Head: Normocephalic and atraumatic.     Right Ear: External ear normal.     Left Ear: External ear normal.     Nose: Nose normal.     Mouth/Throat:  Mouth: Mucous membranes are moist.     Pharynx: Oropharynx is clear. No oropharyngeal exudate or posterior oropharyngeal erythema.  Eyes:     Extraocular Movements: Extraocular movements intact.  Cardiovascular:     Rate and Rhythm: Tachycardia present. Rhythm irregular.     Pulses: Normal pulses.     Heart sounds: Normal heart sounds. No murmur heard.   No friction rub. No gallop.     Comments: Irregularly irregular.  Tachycardic with rates fluctuating between 130 to 150 bpm. Pulmonary:     Effort: Pulmonary effort is normal. No respiratory distress.     Breath sounds: Normal breath sounds. No stridor. No wheezing, rhonchi or rales.  Abdominal:     General: Abdomen is flat.     Tenderness: There is no abdominal tenderness.  Musculoskeletal:        General: Normal range of motion.     Cervical back: Normal range of motion and neck supple. No tenderness.  Skin:    General: Skin is warm and dry.  Neurological:     General: No focal deficit present.     Mental Status: She is alert and oriented to person, place, and time.  Psychiatric:        Mood and Affect: Mood normal.        Behavior: Behavior normal.    ED Results / Procedures / Treatments   Labs (all labs ordered are listed, but only abnormal results are displayed) Labs Reviewed  COMPREHENSIVE METABOLIC PANEL - Abnormal; Notable for the following components:      Result Value   Glucose, Bld 147 (*)    Creatinine, Ser 1.19 (*)    GFR, Estimated 52 (*)    All other components within normal limits  CBC WITH DIFFERENTIAL/PLATELET  MAGNESIUM  TSH  PROTIME-INR  APTT  CBC    EKG EKG Interpretation  Date/Time:  Wednesday  October 31 2021 22:34:16 EST Ventricular Rate:  152 PR Interval:    QRS Duration: 80 QT Interval:  310 QTC Calculation: 511 R Axis:   46 Text Interpretation: Atrial fibrillation with rapid V-rate Abnormal R-wave progression, early transition Minimal ST depression Confirmed by Quintella Reichert [phone]) on 10/31/2021 11:28:09 PM  Radiology DG Chest Portable 1 View  Result Date: 10/31/2021 CLINICAL DATA:  Shortness of breath, tachycardia EXAM: PORTABLE CHEST 1 VIEW COMPARISON:  03/24/2021 FINDINGS: Single frontal view of the chest demonstrates an unremarkable cardiac silhouette. No airspace disease, effusion, or pneumothorax. No acute bony abnormalities. IMPRESSION: 1. No acute intrathoracic process. Electronically Signed   By: Randa Ngo M.D.   On: 10/31/2021 22:38    Procedures Procedures   Medications Ordered in ED Medications  magnesium sulfate IVPB 1 g 100 mL (0 g Intravenous Stopped 11/01/21 0507)  potassium chloride SA (KLOR-CON M) CR tablet 40 mEq (40 mEq Oral Given 10/31/21 2345)    ED Course  I have reviewed the triage vital signs and the nursing notes.  Pertinent labs & imaging results that were available during my care of the patient were reviewed by me and considered in my medical decision making (see chart for details).  Clinical Course as of 11/01/21 [id number]  Wed Oct 31, 2021  2329 Patient discussed with Dr. Vickki Muff with cardiology.  Recommends patient be started on a heparin drip as well as a diltiazem drip.  Correct lytes with a goal magnesium of 2 and a potassium of 4.  Consider admission for observation. [LJ]  2341 Patient reassessed.  It appears that she is  converted to sinus rhythm prior to being given Cardizem or heparin.  We will give patient magnesium as well as Klor-Con.  We will continue to observe.  We will recheck an ECG. [LJ]  Thu Nov 01, 2021  [id number] Patient reassessed.  Currently receiving IV magnesium.  Heart rate in the mid 60s.  Appears to be sinus rhythm.   Patient states her symptoms have resolved. [LJ]    Clinical Course User Index [LJ] Rayna Sexton, PA-C   MDM Rules/Calculators/A&P                          Pt is a 60 y.o. female who presents to the emergency department with palpitations, shortness of breath, lightheadedness.  Patient has a history of atrial fibrillation.  She is not currently anticoagulated.  Labs: CBC without abnormalities. APTT within normal limits at 35. PT/INR within normal limits at 13.2 and 1.0 respectively. CMP with a glucose of 147, creatinine of 1.19, GFR 52. Magnesium within normal limits at 1.9. TSH within normal limits at 3.819.  Imaging: Chest x-ray shows no acute intrathoracic process.  I, Rayna Sexton, PA-C, personally reviewed and evaluated these images and lab results as part of my medical decision-making.  Patient initially presented in atrial fibrillation with RVR.  Patient took her regular 240 mg of Cardizem this morning and then at the onset of her palpitations this evening took an additional 120 mg of Cardizem which she was instructed to do by her cardiologist.  Upon arrival her heart rate was irregular and fluctuating between 130 to 150 bpm.  Patient was discussed with cardiology who recommended that patient be heparinized and started on a Cardizem drip.  I reevaluated the patient prior to initiating this and she had converted to normal sinus rhythm.  She states that her shortness of breath and lightheadedness have since resolved.  Cardiology recommended a goal potassium of 4 and magnesium of 2.  This was repleted with Klor-Con as well as IV magnesium.  Patient reassessed on multiple occasions over the course of the night.  Still appears to be in normal sinus rhythm.  Asymptomatic.  Lying comfortably in bed.  Feel that she is stable for discharge at this time and she is agreeable.  We discussed return precautions.  Recommended follow-up with her cardiologist.  Her questions were answered and  she was amicable at the time of discharge.  Note: Portions of this report may have been transcribed using voice recognition software. Every effort was made to ensure accuracy; however, inadvertent computerized transcription errors may be present.   Final Clinical Impression(s) / ED Diagnoses Final diagnoses:  Atrial fibrillation with RVR [facility])   Rx / DC Orders ED Discharge Orders     None        Rayna Sexton, PA-C 11/01/21 0532    Quintella Reichert, MD 11/01/21 [phone]

## 2021-11-01 ENCOUNTER — Encounter (HOSPITAL_COMMUNITY): Payer: Self-pay

## 2021-11-01 LAB — CBC
HCT: 38.4 % (ref 36.0–46.0)
Hemoglobin: 12.8 g/dL (ref 12.0–15.0)
MCH: 29.1 pg (ref 26.0–34.0)
MCHC: 33.3 g/dL (ref 30.0–36.0)
MCV: 87.3 fL (ref 80.0–100.0)
Platelets: 290 10*3/uL (ref 150–400)
RBC: 4.4 MIL/uL (ref 3.87–5.11)
RDW: 12.9 % (ref 11.5–15.5)
WBC: 7.3 10*3/uL (ref 4.0–10.5)
nRBC: 0 % (ref 0.0–0.2)

## 2021-11-01 LAB — TSH: TSH: 3.819 u[IU]/mL (ref 0.350–4.500)

## 2021-11-01 NOTE — ED Notes (Signed)
Have still not received magnesium yet. Pharmacy messaged again

## 2021-11-01 NOTE — ED Notes (Signed)
Discharge instructions given to pt. Pt resting quietly

## 2021-11-01 NOTE — Discharge Instructions (Addendum)
It appears that you were in atrial fibrillation with RVR.  This resolved since coming to the emergency department.  Please continue to take your prescribed medications.  Please follow-up with your cardiologist regarding your visit tonight.  If you develop any new or worsening symptoms please come back to the emergency department immediately.

## 2021-11-01 NOTE — ED Notes (Signed)
Message sent to pharmacy to request magnesium to be sent

## 2021-11-01 NOTE — ED Notes (Signed)
Patient is resting comfortably. Monitoring vitals.  Denies pain. Updated on POC. Still waiting on pharmacy to send magnesium

## 2021-11-01 NOTE — ED Notes (Signed)
Patient is resting comfortably.
